# Patient Record
Sex: Female | Born: 1970 | Race: White | Hispanic: Yes | Marital: Married | Smoking: Never smoker
Health system: Southern US, Community
[De-identification: ages and names within clinical notes are randomized; demographics above are authoritative.]

## PROBLEM LIST (undated history)

## (undated) DIAGNOSIS — E669 Obesity, unspecified: Secondary | ICD-10-CM

## (undated) HISTORY — DX: Obesity, unspecified: E66.9

---

## 1992-09-18 HISTORY — PX: TUBAL LIGATION: SHX77

## 1993-09-18 DIAGNOSIS — H7292 Unspecified perforation of tympanic membrane, left ear: Secondary | ICD-10-CM

## 1993-09-18 HISTORY — DX: Unspecified perforation of tympanic membrane, left ear: H72.92

## 2010-05-17 ENCOUNTER — Emergency Department (HOSPITAL_COMMUNITY): Admission: EM | Admit: 2010-05-17 | Discharge: 2010-05-18 | Payer: Self-pay | Admitting: Emergency Medicine

## 2010-05-22 ENCOUNTER — Emergency Department (HOSPITAL_COMMUNITY): Admission: EM | Admit: 2010-05-22 | Discharge: 2010-05-22 | Payer: Self-pay | Admitting: Emergency Medicine

## 2010-12-01 LAB — COMPREHENSIVE METABOLIC PANEL
ALT: 28 U/L (ref 0–35)
AST: 23 U/L (ref 0–37)
Albumin: 3.9 g/dL (ref 3.5–5.2)
Alkaline Phosphatase: 65 U/L (ref 39–117)
BUN: 11 mg/dL (ref 6–23)
Calcium: 8.8 mg/dL (ref 8.4–10.5)
Chloride: 110 mEq/L (ref 96–112)
Creatinine, Ser: 0.55 mg/dL (ref 0.4–1.2)
Glucose, Bld: 96 mg/dL (ref 70–99)
Potassium: 3.9 mEq/L (ref 3.5–5.1)

## 2010-12-01 LAB — DIFFERENTIAL
Basophils Absolute: 0.1 10*3/uL (ref 0.0–0.1)
Eosinophils Absolute: 0.2 10*3/uL (ref 0.0–0.7)
Eosinophils Relative: 2 % (ref 0–5)
Lymphocytes Relative: 42 % (ref 12–46)
Lymphs Abs: 3.4 10*3/uL (ref 0.7–4.0)
Monocytes Absolute: 0.5 10*3/uL (ref 0.1–1.0)
Neutro Abs: 4 10*3/uL (ref 1.7–7.7)

## 2010-12-01 LAB — POCT CARDIAC MARKERS

## 2010-12-01 LAB — LIPASE, BLOOD: Lipase: 28 U/L (ref 11–59)

## 2010-12-01 LAB — CBC
HCT: 36 % (ref 36.0–46.0)
Hemoglobin: 12.2 g/dL (ref 12.0–15.0)
MCHC: 33.9 g/dL (ref 30.0–36.0)
MCV: 72.7 fL — ABNORMAL LOW (ref 78.0–100.0)
RBC: 4.95 MIL/uL (ref 3.87–5.11)

## 2010-12-02 LAB — HEPATIC FUNCTION PANEL
ALT: 25 U/L (ref 0–35)
AST: 24 U/L (ref 0–37)
Albumin: 4.3 g/dL (ref 3.5–5.2)
Alkaline Phosphatase: 70 U/L (ref 39–117)
Bilirubin, Direct: 0.1 mg/dL (ref 0.0–0.3)
Indirect Bilirubin: 0.5 mg/dL (ref 0.3–0.9)
Total Protein: 7.8 g/dL (ref 6.0–8.3)

## 2010-12-02 LAB — URINALYSIS, ROUTINE W REFLEX MICROSCOPIC
Bilirubin Urine: NEGATIVE
Glucose, UA: NEGATIVE mg/dL
Hgb urine dipstick: NEGATIVE
Nitrite: NEGATIVE
Urobilinogen, UA: 0.2 mg/dL (ref 0.0–1.0)
pH: 6.5 (ref 5.0–8.0)

## 2010-12-02 LAB — CBC
HCT: 38.4 % (ref 36.0–46.0)
MCH: 24.4 pg — ABNORMAL LOW (ref 26.0–34.0)
MCHC: 33.3 g/dL (ref 30.0–36.0)
MCV: 73.1 fL — ABNORMAL LOW (ref 78.0–100.0)
Platelets: 251 10*3/uL (ref 150–400)

## 2010-12-02 LAB — BASIC METABOLIC PANEL
BUN: 10 mg/dL (ref 6–23)
Calcium: 9.3 mg/dL (ref 8.4–10.5)
Chloride: 108 mEq/L (ref 96–112)
Creatinine, Ser: 0.68 mg/dL (ref 0.4–1.2)
GFR calc non Af Amer: >60 mL/min (ref >60)

## 2010-12-02 LAB — DIFFERENTIAL
Basophils Absolute: 0.1 10*3/uL (ref 0.0–0.1)
Basophils Relative: 1 % (ref 0–1)
Lymphs Abs: 4.2 10*3/uL — ABNORMAL HIGH (ref 0.7–4.0)
Monocytes Relative: 8 % (ref 3–12)

## 2010-12-02 LAB — URINE MICROSCOPIC-ADD ON

## 2011-10-29 IMAGING — CT CT ABD-PELV W/ CM
2 of 4 series · 12 of 36 positions shown, 19 images · IV contrast (water & 100ml omni 300)
Comparison: None.

CLINICAL DATA: Abdominal pain.  Epigastric pain.  Nausea.

CT ABDOMEN AND PELVIS WITH CONTRAST
TECHNIQUE: Multidetector CT imaging of the abdomen and pelvis was
performed following the standard protocol during bolus
administration of intravenous contrast.
Contrast: 100 ml [id number].

[Series 2: routine abdomen · axial · 0.98mm/px · z∈[-459,-39]mm · 11 of 102 slices shown, 17 images]
[im 9/102  soft-tissue]
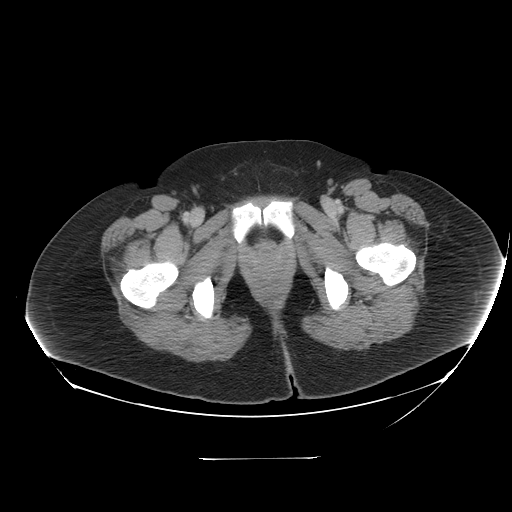
[im 9/102  bone]
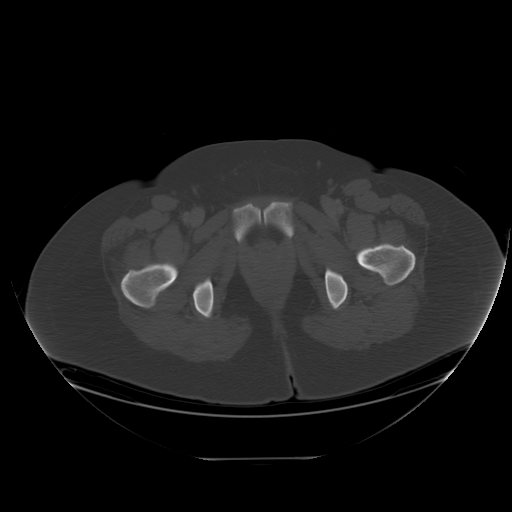
[im 17/102  soft-tissue]
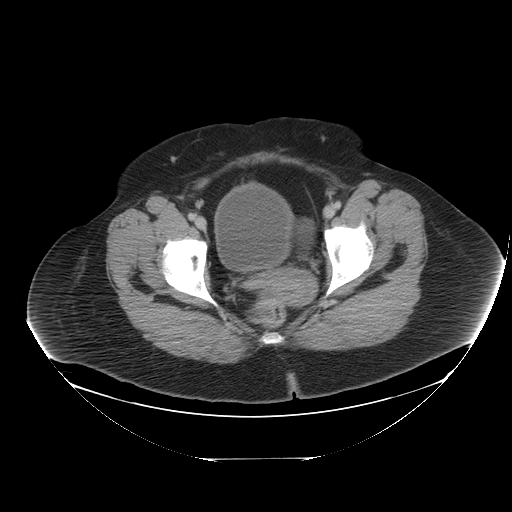
[im 26/102  soft-tissue]
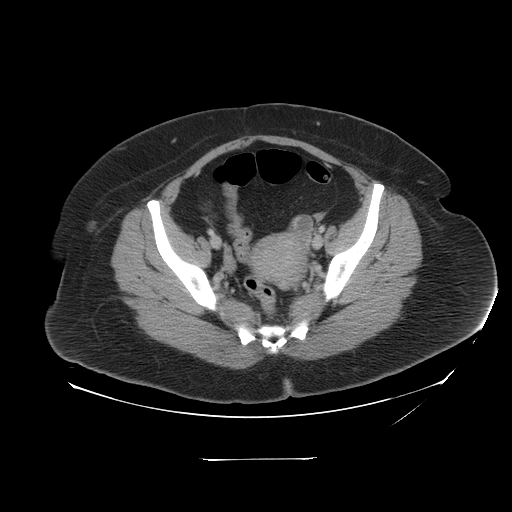
[im 34/102  soft-tissue]
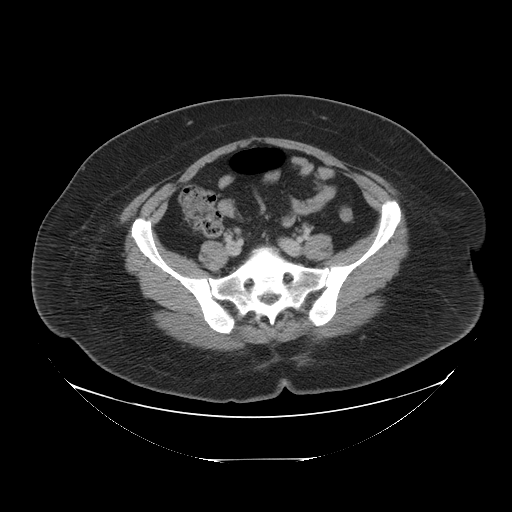
[im 43/102  soft-tissue]
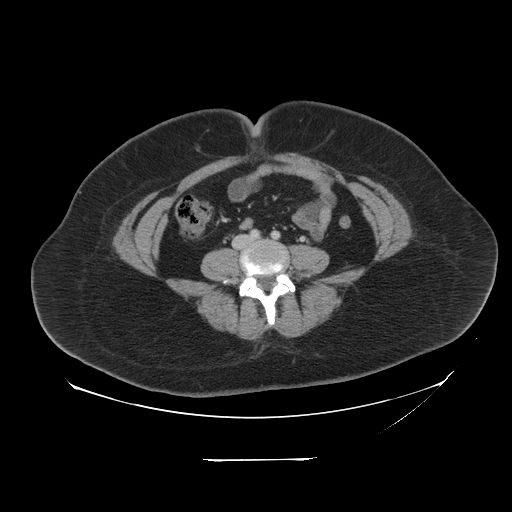
[im 51/102  soft-tissue]
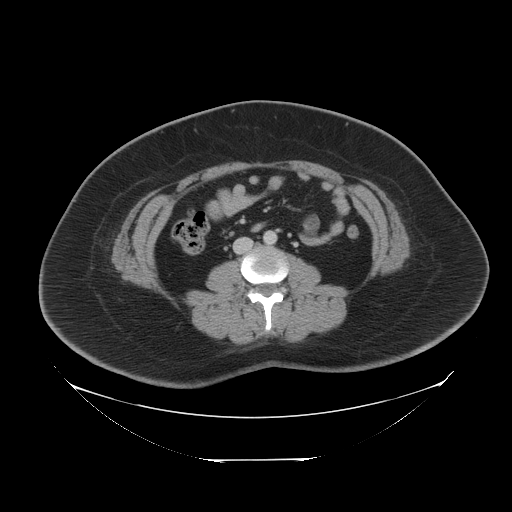
[im 59/102  soft-tissue]
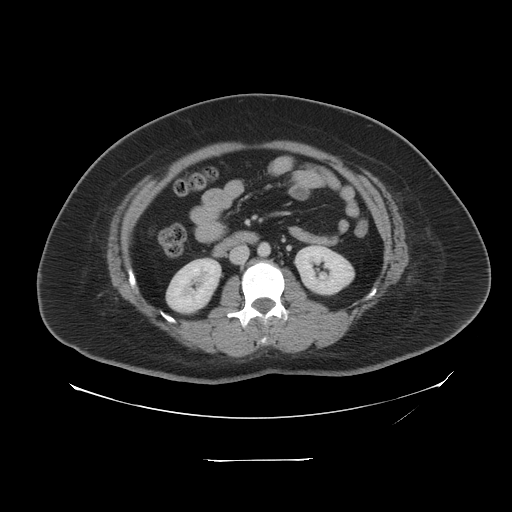
[im 68/102  soft-tissue]
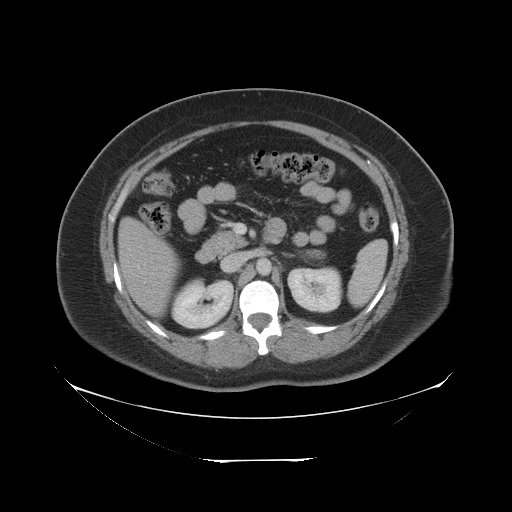
[im 68/102  lung]
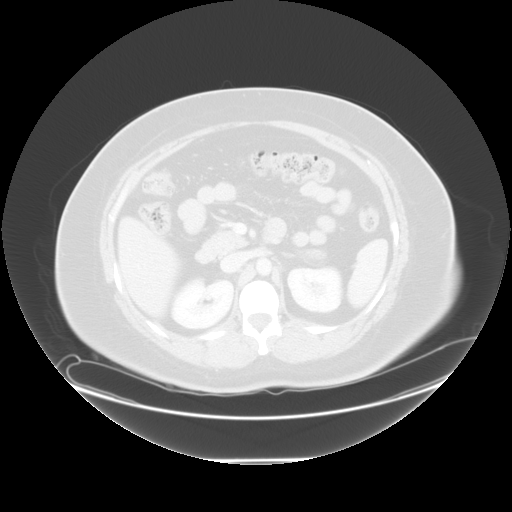
[im 76/102  soft-tissue]
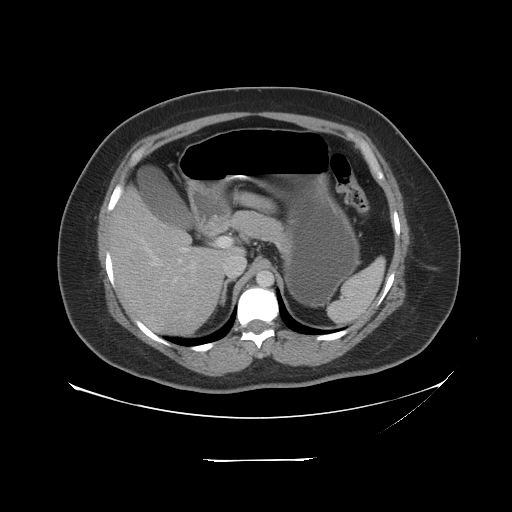
[im 76/102  lung]
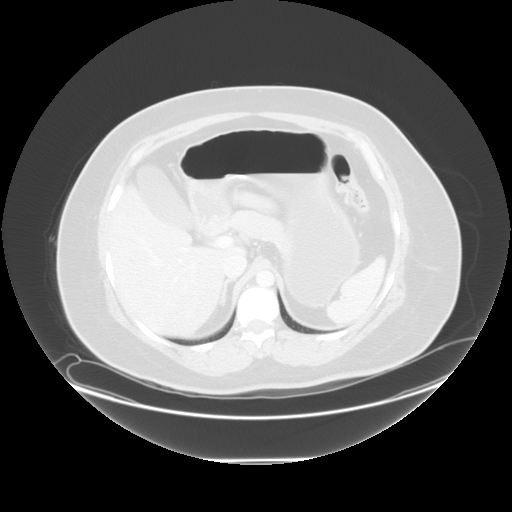
[im 76/102  bone]
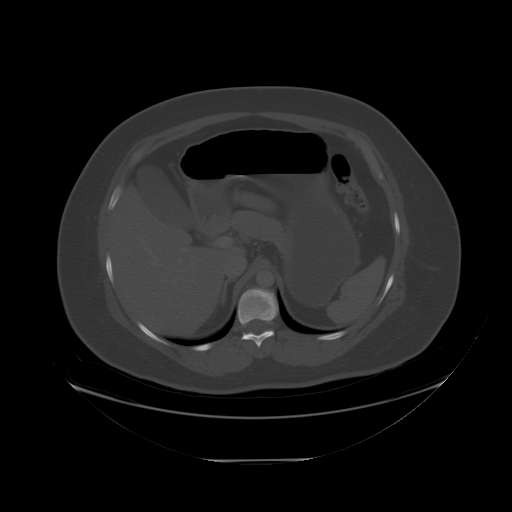
[im 85/102  soft-tissue]
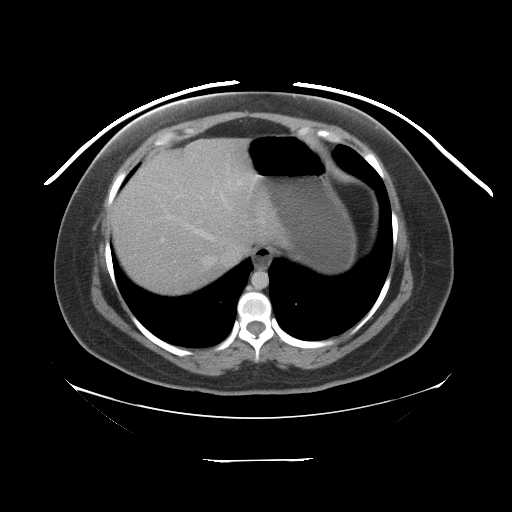
[im 85/102  lung]
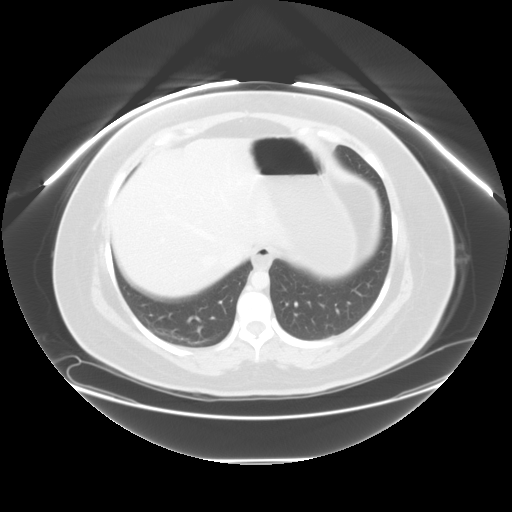
[im 93/102  soft-tissue]
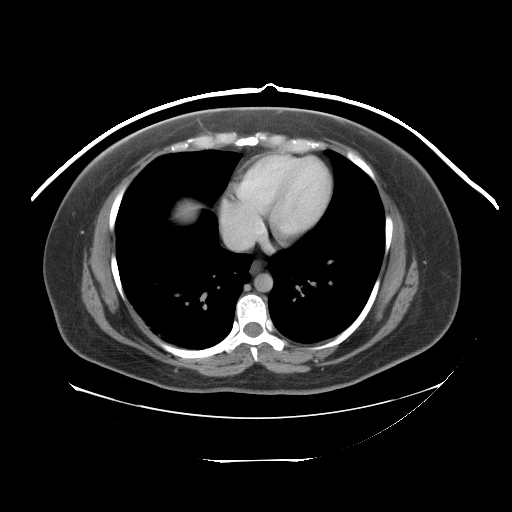
[im 93/102  lung]
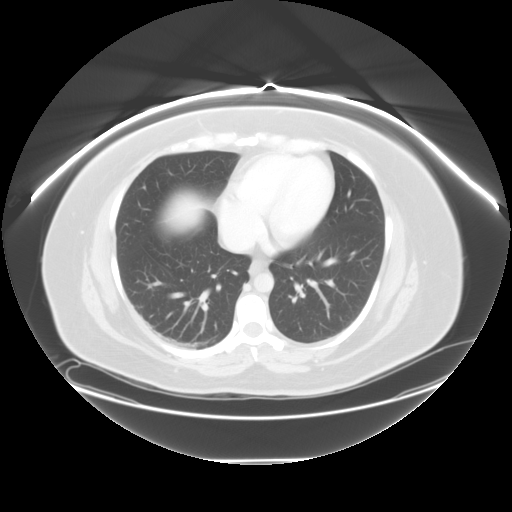

[Series 400: sag · sagittal · 1.01mm/px · 1 of 150 slices shown, 2 images]
[im 50/150  soft-tissue]
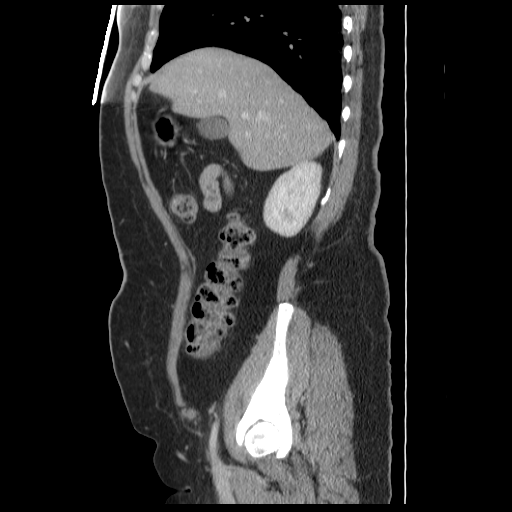
[im 50/150  bone]
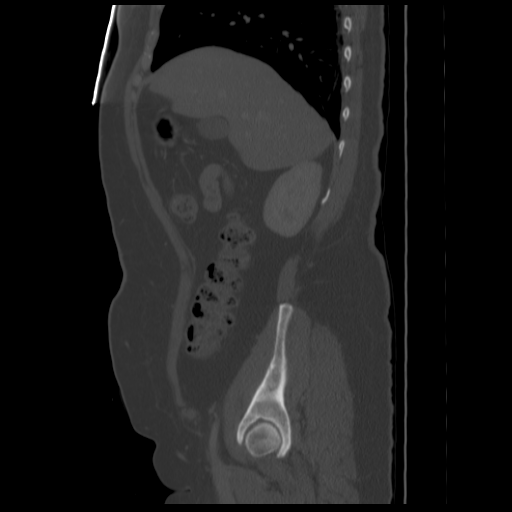

[12 of 36 positions shown; findings below may reference images not displayed]

FINDINGS: Lung bases show dependent atelectasis.  Liver,
gallbladder, pancreas, spleen appear within normal limits.  Common
bile duct is normal.  Stomach distended with negative oral
contrast.  Duodenum normal.  Small bowel normal caliber.  Normal
appendix identified.  No free fluid or lymphadenopathy.  Abdominal
vasculature normal.

Left ovarian cyst is present measuring 5.4 x 3.1 cm.  6-week follow
up transvaginal ultrasound recommended to ensure resolution.
Urinary bladder appears within normal limits.  The kidneys show
normal enhancement.  Adrenal glands normal.
IMPRESSION: 1.  No acute abnormality.
2.  5.4 cm long axis left adnexal cyst.  Follow-up 6-week
transvaginal ultrasound recommended to ensure resolution.

## 2013-10-19 ENCOUNTER — Encounter (HOSPITAL_COMMUNITY): Payer: Self-pay | Admitting: Emergency Medicine

## 2013-10-19 ENCOUNTER — Emergency Department (HOSPITAL_COMMUNITY)
Admission: EM | Admit: 2013-10-19 | Discharge: 2013-10-19 | Disposition: A | Discharge status: Home / Self Care | Payer: Self-pay | Attending: Emergency Medicine | Admitting: Emergency Medicine

## 2013-10-19 ENCOUNTER — Emergency Department (HOSPITAL_COMMUNITY): Payer: Self-pay

## 2013-10-19 DIAGNOSIS — Z79899 Other long term (current) drug therapy: Secondary | ICD-10-CM | POA: Insufficient documentation

## 2013-10-19 DIAGNOSIS — J069 Acute upper respiratory infection, unspecified: Secondary | ICD-10-CM | POA: Insufficient documentation

## 2013-10-19 DIAGNOSIS — J029 Acute pharyngitis, unspecified: Secondary | ICD-10-CM

## 2013-10-19 MED ORDER — DEXAMETHASONE SODIUM PHOSPHATE 10 MG/ML IJ SOLN
10.0000 mg | Freq: Once | INTRAMUSCULAR | Status: AC
  Administered 2013-10-19: 10 mg via INTRAMUSCULAR
  Filled 2013-10-19: qty 1

## 2013-10-19 MED ORDER — HYDROCODONE-HOMATROPINE 5-1.5 MG/5ML PO SYRP: 5.0000 mL | ORAL_SOLUTION | Freq: Four times a day (QID) | ORAL | Status: DC | PRN

## 2013-10-19 NOTE — ED Provider Notes (Signed)
CSN: [id number]     Arrival date & time 10/19/13  1111 History   First MD Initiated Contact with Patient 10/19/13 1122     Chief Complaint  Patient presents with  . Sore Throat  . Fever   (Consider location/radiation/quality/duration/timing/severity/associated sxs/prior Treatment) Patient is a 43 y.o. female presenting with pharyngitis. The history is provided by the patient and a relative. The history is limited by a language barrier. Language interpreter used: patient's son.  Sore Throat This is a new problem. The current episode started in the past 7 days. The problem occurs constantly. The problem has been unchanged. Associated symptoms include congestion, coughing, a fever and a sore throat. Pertinent negatives include no abdominal pain, anorexia, arthralgias, change in bowel habit, chest pain, chills, diaphoresis, fatigue, headaches, joint swelling, myalgias, nausea, neck pain, numbness, rash, swollen glands, urinary symptoms, vertigo, visual change, vomiting or weakness. The symptoms are aggravated by drinking, eating and swallowing. She has tried NSAIDs and acetaminophen (augmentin, tessalon) for the symptoms. The treatment provided moderate relief.    History reviewed. No pertinent past medical history. History reviewed. No pertinent past surgical history. No family history on file. History  Substance Use Topics  . Smoking status: Never Smoker   . Smokeless tobacco: Not on file  . Alcohol Use: No   OB History   Grav Para Term Preterm Abortions TAB SAB Ect Mult Living                 Review of Systems  Constitutional: Positive for fever. Negative for chills, diaphoresis and fatigue.  HENT: Positive for congestion and sore throat.   Respiratory: Positive for cough.   Cardiovascular: Negative for chest pain.  Gastrointestinal: Negative for nausea, vomiting, abdominal pain, anorexia and change in bowel habit.  Musculoskeletal: Negative for arthralgias, joint swelling, myalgias  and neck pain.  Skin: Negative for rash.  Neurological: Negative for vertigo, weakness, numbness and headaches.    Allergies  Review of patient's allergies indicates no known allergies.  Home Medications   Current Outpatient Rx  Name  Route  Sig  Dispense  Refill  . amoxicillin-clavulanate (AUGMENTIN) 875-125 MG per tablet   Oral   Take 1 tablet by mouth 2 (two) times daily.         . benzonatate (TESSALON) 100 MG capsule   Oral   Take 100 mg by mouth 3 (three) times daily as needed for cough.         Marland Kitchen ibuprofen (ADVIL,MOTRIN) 800 MG tablet   Oral   Take 800 mg by mouth every 8 (eight) hours as needed for moderate pain.         Marland Kitchen loratadine (CLARITIN) 10 MG tablet   Oral   Take 10 mg by mouth daily.          BP 101/61  Pulse 103  Temp(Src) 98.8 F (37.1 C) (Oral)  Resp 20  SpO2 98%  LMP 10/12/2013 Physical Exam Appears moderately ill but not toxic; temperature as noted in vitals. Ears normal. Eyes:glassy appearance, no discharge  Heart: RRR, NO M/G/R Throat and pharynx with mild erythema, some tonsillar swelling Neck supple. No adenopathyhy in the neck.  Sinuses non tender.  The chest is clear. Abdomen is soft and non tender  ED Course  Procedures (including critical care time) Labs Review Labs Reviewed - No data to display Imaging Review Dg Chest 2 View  10/19/2013   CLINICAL DATA:  Sore throat, fever and body aches  EXAM: CHEST  2  VIEW  COMPARISON:  None.  FINDINGS: The heart size and mediastinal contours are within normal limits. Both lungs are clear. The visualized skeletal structures are unremarkable.  IMPRESSION: No active cardiopulmonary disease.   Electronically Signed   By: Amie Portlandavid  Ormond M.D.   On: 10/19/2013 12:01    EKG Interpretation   None       MDM  No diagnosis found. Patient tachycardic, likely secondary to decreased oral intake. Patient is currently on augnmentin. She has only four days of sxs. I feel she is not yet improving  because this is likely a viral infection. Patient will get decadron here to decrease pharyngeal swelling. She is afebrile. Supportive care at home and continue ABX. Return precautions discussed.    Arthor CaptainAbigail Tyreonna Czaplicki, PA-C 10/22/13 [phone]

## 2013-10-19 NOTE — ED Notes (Signed)
Pt from home c/o sore throat, fever x4 days. Pt denies N/V/D. Pt is A&O and in NAD. Pt does not speak english, adult family is interpreting.

## 2013-10-19 NOTE — Discharge Instructions (Signed)
You appear to have an upper respiratory infection (URI). An upper respiratory tract infection, or cold, is a viral infection of the air passages leading to the lungs. It is contagious and can be spread to others, especially during the first 3 or 4 days. It cannot be cured by antibiotics or other medicines. RETURN IMMEDIATELY IF you develop shortness of breath, confusion or altered mental status, a new rash, become dizzy, faint, or poorly responsive, or are unable to be cared for at home.   Infeccin de las vas areas superiores en los adultos  (Upper Respiratory Infection, Adult)    La infeccin respiratoria de las vas areas superiores se conoce tambin como resfro comn. Las vas areas superiores Baxter Internationalincluyen los senos nasales, la garganta, la trquea, y los bronquios. Los bronquios son las vas areas que conducen el aire a los pulmones. La mayor parte de las personas mejora luego de una Enfieldsemana, Biomedical engineerpero los sntomas pueden durar The Interpublic Group of Companieshasta dos semanas. La tos residual puede durar ms.  CAUSAS  Varios tipos de virus pueden causar la infeccin de los tejidos que cubren las vas areas superiores. Los tejidos se irritan y se inflaman y se originan secreciones. Tambin es frecuente la produccin de moco. El resfro es contagioso. El virus se disemina fcilmente a otras personas por contacto oral. Aqu se incluyen los besos, el compartir un vaso y el toser o Engineering geologistestornudar. Tambin puede diseminarse tocndose la boca o la [place]nariz y luego tocando una superficie que luego tocan Economistotras personas.  SNTOMAS  Los sntomas se desarrollan entre uno y Hernandezlandtres das luego de Cytogeneticistentrar en contacto con el virus. Pueden variar de Neomia Dearuna persona a otra. Incluyen:  Secrecin nasal.  Estornudos  Congestin nasal.  Irritacin de los senos nasales.  Dolor de Advertising copywritergarganta.  Prdida de la voz (laringitis).  Tos.  Fatiga.  Dolores musculares.  Prdida del apetito.  Dolor de Turkmenistancabeza.  Fiebre no muy elevada. DIAGNSTICO  Puede diagnosticarse a  s mismo la infeccin respiratoria, segn los sntomas habituales, ya que la mayor parte de las personas se resfra dos o tres veces al ao. El profesional puede confirmarlo basndose en el examen fsico. Lo ms importante es que el profesional verifique que los sntomas no se deben a otra enfermedad como anginas, sinusitis, neumona, asma o epiglotitis. Para diagnosticar el resfrio comn, no es necesario que haga anlisis de Cartersangre, pruebas en la garganta o radiografas, pero en algunos casos puede ser de utilidad para excluir otros problemas ms graves. El mdico decidir si necesita otras pruebas.  RIESGOS Y COMPLICACIONES  Tendr mayor riesgo de sufrir un resfro grave si consume cigarrillos, sufre una enfermedad cardaca (como insuficiencia cardaca) o pulmonar crnica (como asma) o si tiene un debilitamiento del sistema inmunolgico. Las personas muy jvenes o muy mayores tienen riesgo de sufrir infecciones ms graves. La sinusitis bacteriana, las infecciones del odo medio y la neumona bacteriana pueden complicar el resfro comn. El resfro puede exacerbar el asma y la enfermedad pulmonar obstructiva crnica. En algunos casos estas complicaciones requieren la atencin en un servicio de emergencias y pueden poner en peligro la vida.  PREVENCIN  La mejor manera de protegerse para no contraer un resfro es Pharmacologistmantener una buena higiene. Evite el contacto bucal o de las manos con personas con sntomas de resfro. Si se produce el contacto, lvese las manos con frecuencia. No hay pruebas firmes que indiquen que la vitamina C, la vitamina E, la equincea o la actividad fsica reduzcan las posibilidades de tener una infeccin.  Sin embargo, siempre se recomienda Insurance account manager y Winferd Humphrey buena nutricin.  TRATAMIENTO  El tratamiento est dirigido a Consulting civil engineer sntomas. Esta enfermedad no tiene [place]. Los antibiticos no son eficaces, ya que esta infeccin la causa un virus y no una bacteria. El tratamiento  incluye:  Aumente la ingesta de lquidos. Consumo de bebidas deportivas, que proporcionan electrolitos,azcares e hidratacin.  Inhale vapor caliente (de un vaporizador o de la ducha).  Tomar sopa de pollo u otros lquidos claros, y [employer] buena nutricin.  Descanse lo suficiente.  Haga grgaras o coma pastillas para [employer].  Control de la fiebre con ibuprofeno o acetaminofen, segn las indicaciones del mdico.  Aumento del uso del inhalador, si sufre asma. Las pastillas y los geles de zinc durante las primeras 24 horas de iniciado el resfro comn, pueden disminuir la duracin y Paramedic la gravedad de los sntomas. Los medicamentos para Chief Technology Officer pueden disminuir la fiebre, Paramedic los dolores musculares y Chief Technology Officer de Advertising copywriter. Se dispone de una gran variedad de medicamentos de venta libre para tratar la congestin y la secrecin nasal. El profesional podr recomendarle inhalantes para los otros sntomas.  INSTRUCCIONES PARA EL CUIDADO DOMICILIARIO  Utilice los medicamentos de venta libre o de prescripcin para Chief Technology Officer, el malestar o la [place], segn se lo indique el profesional que lo asiste.  Utilice un vaporizador caliente o inhale vapor, haciendo salir agua de la ducha para aumentar la humedad [place]. Esto mantendr las secreciones hmedas y Community education officer ms fcil respirar.  Beba gran cantidad de lquido para mantener la orina de tono claro o color amarillo plido.  Descanse todo lo que pueda.  Regrese a su trabajo cuando la temperatura se haya normalizado, o cuando el profesional que lo asiste se lo indique. Quizs sea necesario que permanezca en su casa durante un tiempo ms prolongado para Buyer, retail a Economist. Tambin puede utilizar un barbijo y ser cuidadoso con el lavado de manos para evitar la diseminacin del virus. SOLICITE ATENCIN MDICA SI:  Luego de los primeros das siente que empeora en vez de [place].  Necesita que Occupational psychologist brinde ms  informacin relacionada con los medicamentos para [employer] sntomas.  Siente escalofros, le falta el aire o escupe moco de color marrn o rojo. Estos pueden ser sntomas de neumona.  Tiene una secrecin nasal de color amarillo o marrn, o siente dolor en el rostro, especialmente cuando se inclina hacia adelante. Estos pueden ser sntomas de sinusitis.  Tiene fiebre, siente el cuello hinchado, tiene dolor al tragar u observa manchas blancas en el fondo de la garganta. Estos pueden ser sntomas de angina por estreptococo. Romona Curls ATENCIN MDICA DE INMEDIATO SI:  Lance Muss.  Comienza a sentir Herbalist de cabeza intenso o persistente, dolor de odos, en el seno nasal o en el pecho.  Tiene tos y esta se prolonga demasiado, tose y escupe sangre, la mucosidad habitual se modifica (si tiene una enfermedad pulmonar crnica) o respira con dificultad.  Siente rigidez en el cuello o dolor de cabeza intenso. Document Released: 06/14/2005 Document Revised: 11/27/2011  [facility] Patient Information 2014 [place], [place].

## 2013-10-22 NOTE — ED Provider Notes (Signed)
Medical screening examination/treatment/procedure(s) were performed by non-physician practitioner and as supervising physician I was immediately available for consultation/collaboration.  EKG Interpretation   None         David H Yao, MD 10/22/13 0659 

## 2013-11-18 ENCOUNTER — Ambulatory Visit: Payer: No Typology Code available for payment source | Attending: Internal Medicine

## 2013-11-21 ENCOUNTER — Encounter: Payer: Self-pay | Admitting: Internal Medicine

## 2013-11-21 ENCOUNTER — Ambulatory Visit: Payer: No Typology Code available for payment source | Attending: Internal Medicine | Admitting: Internal Medicine

## 2013-11-21 VITALS — BP 101/71 | HR 63 | Temp 97.8°F | Resp 14 | Ht 64.0 in | Wt 219.8 lb

## 2013-11-21 DIAGNOSIS — J029 Acute pharyngitis, unspecified: Secondary | ICD-10-CM

## 2013-11-21 DIAGNOSIS — Z0000 Encounter for general adult medical examination without abnormal findings: Secondary | ICD-10-CM

## 2013-11-21 LAB — COMPREHENSIVE METABOLIC PANEL
ALT: 48 U/L — AB (ref 0–35)
AST: 39 U/L — AB (ref 0–37)
Albumin: 4.3 g/dL (ref 3.5–5.2)
Alkaline Phosphatase: 87 U/L (ref 39–117)
BILIRUBIN TOTAL: 0.6 mg/dL (ref 0.2–1.2)
BUN: 15 mg/dL (ref 6–23)
CALCIUM: 9.2 mg/dL (ref 8.4–10.5)
CHLORIDE: 102 meq/L (ref 96–112)
CO2: 26 meq/L (ref 19–32)
Creat: 0.63 mg/dL (ref 0.50–1.10)
Glucose, Bld: 101 mg/dL — ABNORMAL HIGH (ref 70–99)
POTASSIUM: 4.5 meq/L (ref 3.5–5.3)
SODIUM: 136 meq/L (ref 135–145)
TOTAL PROTEIN: 7.3 g/dL (ref 6.0–8.3)

## 2013-11-21 LAB — HEMOGLOBIN A1C
Hgb A1c MFr Bld: 5 % (ref <5.7)
Mean Plasma Glucose: 97 mg/dL (ref <117)

## 2013-11-21 LAB — CBC WITH DIFFERENTIAL/PLATELET
BASOS ABS: 0.1 10*3/uL (ref 0.0–0.1)
Basophils Relative: 1 % (ref 0–1)
Eosinophils Absolute: 0.1 10*3/uL (ref 0.0–0.7)
Eosinophils Relative: 1 % (ref 0–5)
HEMATOCRIT: 37.7 % (ref 36.0–46.0)
HEMOGLOBIN: 12.3 g/dL (ref 12.0–15.0)
LYMPHS PCT: 50 % — AB (ref 12–46)
Lymphs Abs: 3 10*3/uL (ref 0.7–4.0)
MCH: 24.6 pg — AB (ref 26.0–34.0)
MCHC: 32.6 g/dL (ref 30.0–36.0)
MCV: 75.2 fL — ABNORMAL LOW (ref 78.0–100.0)
MONOS PCT: 9 % (ref 3–12)
Monocytes Absolute: 0.5 10*3/uL (ref 0.1–1.0)
NEUTROS ABS: 2.3 10*3/uL (ref 1.7–7.7)
NEUTROS PCT: 39 % — AB (ref 43–77)
Platelets: 277 10*3/uL (ref 150–400)
RBC: 5.01 MIL/uL (ref 3.87–5.11)
RDW: 23 % — AB (ref 11.5–15.5)
WBC: 6 10*3/uL (ref 4.0–10.5)

## 2013-11-21 LAB — LIPID PANEL
Cholesterol: 188 mg/dL (ref 0–200)
HDL: 42 mg/dL (ref >39)
LDL Cholesterol: 126 mg/dL — ABNORMAL HIGH (ref 0–99)
Total CHOL/HDL Ratio: 4.5 Ratio
Triglycerides: 98 mg/dL (ref <150)
VLDL: 20 mg/dL (ref 0–40)

## 2013-11-21 MED ORDER — AMOXICILLIN 500 MG PO CAPS: 500.0000 mg | ORAL_CAPSULE | Freq: Three times a day (TID) | ORAL | Status: DC

## 2013-11-21 MED ORDER — LORATADINE 10 MG PO TABS: 10.0000 mg | ORAL_TABLET | Freq: Every day | ORAL | Status: DC

## 2013-11-21 NOTE — Progress Notes (Unsigned)
Patient ID: Deborah Vaughan, female   DOB: Jan 28, 1971, 43 y.o.   MRN: [id number] Patient Demographics  Deborah Vaughan, is a 43 y.o. female  [id number]  [id number]  DOB - Jan 28, 1971  Chief Complaint  Patient presents with  . Establish Care  . Sore Throat        Subjective:   Deborah Vaughan today is here to establish primary care.  Patient is a 43 year old Hispanic female with no past medical history presented to establish care. Patient reports sore throat for almost a month, no fevers or chills or any nasal congestion. She feels postnasal drip and mucus in the throat, no coughing. She feels sinus pressure headaches. She was seen in the ER and was prescribed Augmentin for acute pharyngitis. History was obtained through the interpreter. Patient has No chest pain, No abdominal pain - No Nausea, No new weakness tingling or numbness, No Cough - SOB.  Objective:    Filed Vitals:   11/21/13 0912  BP: 101/71  Pulse: 63  Temp: 97.8 F (36.6 C)  TempSrc: Oral  Resp: 14  Height: 5\' 4"  (1.626 m)  Weight: 219 lb 12.8 oz (99.701 kg)  SpO2: 96%     ALLERGIES:  No Known Allergies  PAST MEDICAL HISTORY: History reviewed. No pertinent past medical history.  PAST SURGICAL HISTORY: Past Surgical History  Procedure Laterality Date  . Cesarean section      FAMILY HISTORY: Family History  Problem Relation Age of Onset  . Cancer Father   . Cancer Brother   . Diabetes Brother     MEDICATIONS AT HOME: Prior to Admission medications   Medication Sig Start Date End Date Taking? Authorizing Provider  amoxicillin (AMOXIL) 500 MG capsule Take 1 capsule (500 mg total) by mouth 3 (three) times daily. X 10days 11/21/13   Chaniya Genter Jenna LuoK Nataly Pacifico, MD  amoxicillin-clavulanate (AUGMENTIN) 875-125 MG per tablet Take 1 tablet by mouth 2 (two) times daily.    Historical Provider, MD  benzonatate (TESSALON) 100 MG capsule Take 100 mg by mouth 3 (three) times daily as needed for  cough.    Historical Provider, MD  HYDROcodone-homatropine (HYCODAN) 5-1.5 MG/5ML syrup Take 5 mLs by mouth every 6 (six) hours as needed for cough. 10/19/13   Arthor CaptainAbigail Harris, PA-C  ibuprofen (ADVIL,MOTRIN) 800 MG tablet Take 800 mg by mouth every 8 (eight) hours as needed for moderate pain.    Historical Provider, MD  loratadine (CLARITIN) 10 MG tablet Take 10 mg by mouth daily.    Historical Provider, MD    REVIEW OF SYSTEMS:  Constitutional:   No   Fevers, chills, fatigue.  HEENT:    See history of present illness  Cardio-vascular: No chest pain,  Orthopnea, swelling in lower extremities, anasarca, palpitations  GI:  No abdominal pain, nausea, vomiting, diarrhea  Resp: No shortness of breath,  No coughing up of blood.No cough.No wheezing.  Skin:  no rash or lesions.  GU:  no dysuria, change in color of urine, no urgency or frequency.  No flank pain.  Musculoskeletal: No joint pain or swelling.  No decreased range of motion.  No back pain.  Psych: No change in mood or affect. No depression or anxiety.  No memory loss.   Exam  General appearance :Awake, alert, NAD, Speech Clear. HEENT: Atraumatic and Normocephalic, PERLA, bilateral tonsils enlarged, no significant posterior pharyngeal erythema Neck: supple, no JVD. No cervical lymphadenopathy.  Chest: clear to auscultation bilaterally, no wheezing, rales or rhonchi CVS: S1 S2 regular, no murmurs.  Abdomen: soft, NBS, NT, ND, no gaurding, rigidity or rebound. Extremities: No cyanosis, clubbing, B/L Lower Ext shows no edema,  Neurology: Awake alert, and oriented X 3, CN II-XII intact, Non focal Skin:No Rash or lesions Wounds: N/A    Data Review   Basic Metabolic Panel: No results found for this basename: NA, K, CL, CO2, GLUCOSE, BUN, CREATININE, CALCIUM, MG, PHOS,  in the last 168 hours Liver Function Tests: No results found for this basename: AST, ALT, ALKPHOS, BILITOT, PROT, ALBUMIN,  in the last 168  hours  CBC: No results found for this basename: WBC, NEUTROABS, HGB, HCT, MCV, PLT,  in the last 168 hours ------------------------------------------------------------------------------------------------------------------ No results found for this basename: HGBA1C,  in the last 72 hours ------------------------------------------------------------------------------------------------------------------ No results found for this basename: CHOL, HDL, LDLCALC, TRIG, CHOLHDL, LDLDIRECT,  in the last 72 hours ------------------------------------------------------------------------------------------------------------------ No results found for this basename: TSH, T4TOTAL, FREET3, T3FREE, THYROIDAB,  in the last 72 hours ------------------------------------------------------------------------------------------------------------------ No results found for this basename: VITAMINB12, FOLATE, FERRITIN, TIBC, IRON, RETICCTPCT,  in the last 72 hours  Coagulation profile  No results found for this basename: INR, PROTIME,  in the last 168 hours    Assessment & Plan   Active Problems:  Patient Active Problem List   Diagnosis Date Noted  . Acute pharyngitis - obtain rapid strep throat - CBC with diff,  - placed on amxicillin for 10days - will send referral to Gen. surgery for tonsillectomy evaluation   11/21/2013  . Periodic health assessment, general screening, adult - Obtain CBC, cmet, lipid panel  - Patient has strong family history of diabetes, obtain hemoglobin A1c - mammogram screening- never had a mammogram - Scheduled appointment for Pap smear, never had Pap smear before  11/21/2013    Recommendations: Followup in 3 months, labs  Follow-up in 3 months   Kashmere Staffa M.D. 11/21/2013, 9:43 AM

## 2013-11-21 NOTE — Progress Notes (Unsigned)
Patient is here to establish care. Patient complains of a sore throat x1 month; no fever, no chills, nasal congestion, feeling of mucus in the throat, no cough, pressure headaches. Recently taken medication prescribed from other physicians. No longer taking medication. Patient has an interpreter.

## 2013-11-27 ENCOUNTER — Encounter: Payer: Self-pay | Admitting: Family Medicine

## 2013-11-27 ENCOUNTER — Ambulatory Visit: Payer: No Typology Code available for payment source | Attending: Internal Medicine | Admitting: Family Medicine

## 2013-11-27 ENCOUNTER — Other Ambulatory Visit (HOSPITAL_COMMUNITY)
Admission: RE | Admit: 2013-11-27 | Discharge: 2013-11-27 | Disposition: A | Discharge status: Home / Self Care | Payer: No Typology Code available for payment source | Source: Ambulatory Visit | Attending: Internal Medicine | Admitting: Internal Medicine

## 2013-11-27 VITALS — BP 111/76 | HR 69 | Temp 98.7°F | Resp 16 | Ht 62.0 in | Wt 219.0 lb

## 2013-11-27 DIAGNOSIS — Z202 Contact with and (suspected) exposure to infections with a predominantly sexual mode of transmission: Secondary | ICD-10-CM

## 2013-11-27 DIAGNOSIS — N760 Acute vaginitis: Secondary | ICD-10-CM | POA: Insufficient documentation

## 2013-11-27 DIAGNOSIS — N949 Unspecified condition associated with female genital organs and menstrual cycle: Secondary | ICD-10-CM | POA: Insufficient documentation

## 2013-11-27 DIAGNOSIS — J029 Acute pharyngitis, unspecified: Secondary | ICD-10-CM

## 2013-11-27 DIAGNOSIS — R070 Pain in throat: Secondary | ICD-10-CM | POA: Insufficient documentation

## 2013-11-27 DIAGNOSIS — Z1151 Encounter for screening for human papillomavirus (HPV): Secondary | ICD-10-CM | POA: Insufficient documentation

## 2013-11-27 DIAGNOSIS — Z01419 Encounter for gynecological examination (general) (routine) without abnormal findings: Secondary | ICD-10-CM | POA: Insufficient documentation

## 2013-11-27 DIAGNOSIS — R102 Pelvic and perineal pain: Secondary | ICD-10-CM

## 2013-11-27 DIAGNOSIS — Z124 Encounter for screening for malignant neoplasm of cervix: Secondary | ICD-10-CM

## 2013-11-27 DIAGNOSIS — Z113 Encounter for screening for infections with a predominantly sexual mode of transmission: Secondary | ICD-10-CM | POA: Insufficient documentation

## 2013-11-27 MED ORDER — FLUTICASONE PROPIONATE 50 MCG/ACT NA SUSP: 2.0000 | Freq: Every day | NASAL | Status: DC

## 2013-11-27 NOTE — Patient Instructions (Signed)
Pelvic Pain, Female Female pelvic pain can be caused by many different things and start from a variety of places. Pelvic pain refers to pain that is located in the lower half of the abdomen and between your hips. The pain may occur over a short period of time (acute) or may be reoccurring (chronic). The cause of pelvic pain may be related to disorders affecting the female reproductive organs (gynecologic), but it may also be related to the bladder, kidney stones, an intestinal complication, or muscle or skeletal problems. Getting help right away for pelvic pain is important, especially if there has been severe, sharp, or a sudden onset of unusual pain. It is also important to get help right away because some types of pelvic pain can be life threatening.  CAUSES  Below are only some of the causes of pelvic pain. The causes of pelvic pain can be in one of several categories.   Gynecologic.  Pelvic inflammatory disease.  Sexually transmitted infection.  Ovarian cyst or a twisted ovarian ligament (ovarian torsion).  Uterine lining that grows outside the uterus (endometriosis).  Fibroids, cysts, or tumors.  Ovulation.  Pregnancy.  Pregnancy that occurs outside the uterus (ectopic pregnancy).  Miscarriage.  Labor.  Abruption of the placenta or ruptured uterus.  Infection.  Uterine infection (endometritis).  Bladder infection.  Diverticulitis.  Miscarriage related to a uterine infection (septic abortion).  Bladder.  Inflammation of the bladder (cystitis).  Kidney stone(s).  Gastrointenstinal.  Constipation.  Diverticulitis.  Neurologic.  Trauma.  Feeling pelvic pain because of mental or emotional causes (psychosomatic).  Cancers of the bowel or pelvis. EVALUATION  Your caregiver will want to take a careful history of your concerns. This includes recent changes in your health, a careful gynecologic history of your periods (menses), and a sexual history. Obtaining  your family history and medical history is also important. Your caregiver may suggest a pelvic exam. A pelvic exam will help identify the location and severity of the pain. It also helps in the evaluation of which organ system may be involved. In order to identify the cause of the pelvic pain and be properly treated, your caregiver may order tests. These tests may include:   A pregnancy test.  Pelvic ultrasonography.  An X-ray exam of the abdomen.  A urinalysis or evaluation of vaginal discharge.  Blood tests. HOME CARE INSTRUCTIONS   Only take over-the-counter or prescription medicines for pain, discomfort, or fever as directed by your caregiver.   Rest as directed by your caregiver.   Eat a balanced diet.   Drink enough fluids to make your urine clear or pale yellow, or as directed.   Avoid sexual intercourse if it causes pain.   Apply warm or cold compresses to the lower abdomen depending on which one helps the pain.   Avoid stressful situations.   Keep a journal of your pelvic pain. Write down when it started, where the pain is located, and if there are things that seem to be associated with the pain, such as food or your menstrual cycle.  Follow up with your caregiver as directed.  SEEK MEDICAL CARE IF:  Your medicine does not help your pain.  You have abnormal vaginal discharge. SEEK IMMEDIATE MEDICAL CARE IF:   You have heavy bleeding from the vagina.   Your pelvic pain increases.   You feel lightheaded or faint.   You have chills.   You have pain with urination or blood in your urine.   You have uncontrolled  diarrhea or vomiting.   You have a fever or persistent symptoms for more than 3 days.  You have a fever and your symptoms suddenly get worse.   You are being physically or sexually abused.  MAKE SURE YOU:  Understand these instructions.  Will watch your condition.  Will get help if you are not doing well or get worse. Document  Released: 08/01/2004 Document Revised: 03/05/2012 Document Reviewed: 12/25/2011 [facility] Patient Information 2014 [place], [place]. Sore Throat A sore throat is pain, burning, irritation, or scratchiness of the throat. There is often pain or tenderness when swallowing or talking. A sore throat may be accompanied by other symptoms, such as coughing, sneezing, fever, and swollen neck glands. A sore throat is often the first sign of another sickness, such as a cold, flu, strep throat, or mononucleosis (commonly known as mono). Most sore throats go away without medical treatment. CAUSES  The most common causes of a sore throat include:  A viral infection, such as a cold, flu, or mono.  A bacterial infection, such as strep throat, tonsillitis, or whooping cough.  Seasonal allergies.  Dryness in the air.  Irritants, such as smoke or pollution.  Gastroesophageal reflux disease (GERD). HOME CARE INSTRUCTIONS   Only take over-the-counter medicines as directed by your caregiver.  Drink enough fluids to keep your urine clear or pale yellow.  Rest as needed.  Try using throat sprays, lozenges, or sucking on hard candy to ease any pain (if older than 4 years or as directed).  Sip warm liquids, such as broth, herbal tea, or warm water with honey to relieve pain temporarily. You may also eat or drink cold or frozen liquids such as frozen ice pops.  Gargle with salt water (mix 1 tsp salt with 8 oz of water).  Do not smoke and avoid secondhand smoke.  Put a cool-mist humidifier in your bedroom at night to moisten the air. You can also turn on a hot shower and sit in the bathroom with the door closed for 5 10 minutes. SEEK IMMEDIATE MEDICAL CARE IF:  You have difficulty breathing.  You are unable to swallow fluids, soft foods, or your saliva.  You have increased swelling in the throat.  Your sore throat does not get better in 7 days.  You have nausea and vomiting.  You have a fever or  persistent symptoms for more than 2 3 days.  You have a fever and your symptoms suddenly get worse. MAKE SURE YOU:   Understand these instructions.  Will watch your condition.  Will get help right away if you are not doing well or get worse. Document Released: 10/12/2004 Document Revised: 08/21/2012 Document Reviewed: 05/12/2012 [facility] LouisExitCare Patient Information 2014 [place], [place].

## 2013-11-27 NOTE — Progress Notes (Signed)
Subjective:    Patient ID: Deborah Vaughan, female    DOB: [birth year], 43 y.o.   MRN: [id number]  Gynecologic Exam   Patient presents for Pap smear. Never had an abnormal Pap smear last test was 4 years ago she has had priors before that but isn't sure when they were. She denies any abnormal bleeding or discharge. He does complain of some left-sided pelvic pain. Last period was in January and was longer than usual. Has not had a period since that  She's concerned about possible exposure to sexually transmitted diseases she's had one partner in the last year. He has been traveling and so she's concerned that he's been unfaithful to her in the last year.  She continues to have a sore throat. Started in January. It's been waxing and waning over that time. He was treated at the end of January I. an outside provider with Augmentin. Patient says that there was not a throat culture her strep test done. This did not relieve the symptoms. In early February she went to the emergency department and was told she had an upper respiratory infection. In early March she was seen here. It seems that she was swabbed but no culture was done. She was treated empirically with amoxicillin. She was also referred to ENT at that time but hasn't seen them yet. He continues to have is moderate in severity she says that it seems to be aggravated by her sinus drainage. She has chronic postnasal drip and is not on anything for the sinus problem. She has no fevers. Denies sick contacts.   Review of Systems A 12 point review of systems is negative except as per hpi.       Objective:   Physical Exam  Genitourinary: Vagina normal and uterus normal. No labial fusion. There is no rash, tenderness, lesion or injury on the right labia. There is no rash, tenderness, lesion or injury on the left labia. Cervix exhibits no motion tenderness, no discharge and no friability. Right adnexum displays no mass, no tenderness and no  fullness. Left adnexum displays tenderness and fullness. No tenderness or bleeding around the vagina. No foreign body around the vagina. No vaginal discharge found.    Nursing note and vitals reviewed. Constitutional: She is oriented to person, place, and time. She appears well-developed and well-nourished.  HENT:  Mouth/Throat: Oropharynx is clear and moist. No oropharyngeal exudate.  Abdominal: Soft. Bowel sounds are normal. She exhibits no distension. There is no tenderness. There is no rebound.  Neurological: She is alert and oriented to person, place, and time. She has normal reflexes.  Skin: Skin is warm and dry.  Psychiatric: She has a normal mood and affect. Her behavior is normal.        Assessment & Plan:  Deborah Vaughan was seen today for follow-up and gynecologic exam.  Diagnoses and associated orders for this visit:  Sore throat - Throat culture - fluticasone (FLONASE) 50 MCG/ACT nasal spray; Place 2 sprays into both nostrils daily. - MDM - we are hoping that the Flonase helps this problem. It's concerning that her sore throat has been going on for almost 2 months now. At this point it doesn't seem reasonable to prescribe more antibiotics so will wait for the results of the throat culture. Agree with referral to ENT and we'll make sure that she gets in to see them soon. Screening for cervical cancer - Cytology - PAP [place]  Potential exposure to STD - Cytology - PAP [facility] - Have also sent off STD panel and gonorrhea and chlamydia Pelvic pain in female - US Transvaginal Non-OB; Future  F/u - with ENT, get the ultrasound. Followup here in 3 months or earlier if needed. Emergency department if acutely worse.

## 2013-11-27 NOTE — Progress Notes (Signed)
Pt here for pap smear with c/o lower pelvic pain, non radiating  pt is taking ATB

## 2013-11-28 LAB — CERVICOVAGINAL ANCILLARY ONLY
Bacterial vaginitis: POSITIVE — AB
Candida vaginitis: NEGATIVE

## 2013-12-02 ENCOUNTER — Telehealth: Payer: Self-pay | Admitting: *Deleted

## 2013-12-02 NOTE — Telephone Encounter (Signed)
Message copied by Raynelle CharyWINFREE, Bryauna Byrum R on Tue Dec 02, 2013  2:04 PM ------      Message from: Quentin AngstJEGEDE, OLUGBEMIGA E      Created: Sun Nov 30, 2013  8:34 PM       Please inform patient that her swab is positive for bacterial vaginosis, not a sexually transmitted disease, we are still awaiting other results. We will treat this with antibiotics      Please call in metronidazole tablets 500 mg by mouth twice a day for 7 days. ------

## 2013-12-02 NOTE — Telephone Encounter (Signed)
Called pt and unable to leave a message

## 2013-12-04 ENCOUNTER — Other Ambulatory Visit: Payer: Self-pay | Admitting: Family Medicine

## 2013-12-04 ENCOUNTER — Ambulatory Visit (HOSPITAL_COMMUNITY)
Admission: RE | Admit: 2013-12-04 | Discharge: 2013-12-04 | Disposition: A | Discharge status: Home / Self Care | Payer: No Typology Code available for payment source | Source: Ambulatory Visit | Attending: Family Medicine | Admitting: Family Medicine

## 2013-12-04 DIAGNOSIS — R102 Pelvic and perineal pain unspecified side: Secondary | ICD-10-CM

## 2013-12-04 DIAGNOSIS — Q504 Embryonic cyst of fallopian tube: Secondary | ICD-10-CM | POA: Insufficient documentation

## 2013-12-04 DIAGNOSIS — Q505 Embryonic cyst of broad ligament: Principal | ICD-10-CM

## 2013-12-04 DIAGNOSIS — N949 Unspecified condition associated with female genital organs and menstrual cycle: Secondary | ICD-10-CM | POA: Insufficient documentation

## 2013-12-08 ENCOUNTER — Telehealth: Payer: Self-pay | Admitting: *Deleted

## 2013-12-08 NOTE — Telephone Encounter (Signed)
Message copied by Raynelle CharyWINFREE, Shereece Wellborn R on Mon Dec 08, 2013  3:59 PM ------      Message from: Acey LavWOOD, ALLISON L      Created: Mon Dec 08, 2013  1:10 PM       Please let pt know tvus wnl except cyst. Cyst is likely fine and will self resolve. If discomfort in 6-8 weeks, re-eval. thx aw ------

## 2013-12-08 NOTE — Telephone Encounter (Signed)
Pt is aware of her lab results.  

## 2013-12-08 NOTE — Telephone Encounter (Signed)
Message copied by Raynelle CharyWINFREE, Alahni Varone R on Mon Dec 08, 2013  4:08 PM ------      Message from: Acey LavWOOD, ALLISON L      Created: Mon Dec 08, 2013  1:15 PM       Please let pt know pap/gc/chlam/hpv/trich all neg. Thanks AW ------

## 2013-12-08 NOTE — Telephone Encounter (Signed)
Pt is aware of her US results.  

## 2014-01-07 ENCOUNTER — Ambulatory Visit: Payer: No Typology Code available for payment source

## 2014-01-07 ENCOUNTER — Encounter (INDEPENDENT_AMBULATORY_CARE_PROVIDER_SITE_OTHER): Payer: Self-pay

## 2014-01-07 ENCOUNTER — Ambulatory Visit
Admission: RE | Admit: 2014-01-07 | Discharge: 2014-01-07 | Disposition: A | Discharge status: Home / Self Care | Payer: Self-pay | Source: Ambulatory Visit | Attending: Internal Medicine | Admitting: Internal Medicine

## 2014-01-07 DIAGNOSIS — Z0000 Encounter for general adult medical examination without abnormal findings: Secondary | ICD-10-CM

## 2014-01-09 ENCOUNTER — Other Ambulatory Visit: Payer: Self-pay | Admitting: Internal Medicine

## 2014-01-09 DIAGNOSIS — R928 Other abnormal and inconclusive findings on diagnostic imaging of breast: Secondary | ICD-10-CM

## 2014-01-14 ENCOUNTER — Other Ambulatory Visit: Payer: Self-pay | Admitting: Internal Medicine

## 2014-01-14 DIAGNOSIS — R928 Other abnormal and inconclusive findings on diagnostic imaging of breast: Secondary | ICD-10-CM

## 2014-01-20 ENCOUNTER — Ambulatory Visit
Admission: RE | Admit: 2014-01-20 | Discharge: 2014-01-20 | Disposition: A | Discharge status: Home / Self Care | Payer: No Typology Code available for payment source | Source: Ambulatory Visit | Attending: Internal Medicine | Admitting: Internal Medicine

## 2014-01-20 DIAGNOSIS — R928 Other abnormal and inconclusive findings on diagnostic imaging of breast: Secondary | ICD-10-CM

## 2014-02-27 ENCOUNTER — Ambulatory Visit: Payer: No Typology Code available for payment source | Admitting: Internal Medicine

## 2014-04-09 ENCOUNTER — Ambulatory Visit: Payer: No Typology Code available for payment source

## 2014-04-09 VITALS — BP 107/71 | HR 65 | Temp 97.9°F | Resp 16

## 2014-04-09 MED ORDER — IBUPROFEN 600 MG PO TABS: 600.0000 mg | ORAL_TABLET | Freq: Three times a day (TID) | ORAL | Status: DC | PRN

## 2014-04-09 MED ORDER — DIPHENHYDRAMINE HCL 25 MG PO TABS: 25.0000 mg | ORAL_TABLET | Freq: Four times a day (QID) | ORAL | Status: DC | PRN

## 2014-04-09 MED ORDER — FLUTICASONE PROPIONATE 50 MCG/ACT NA SUSP: 2.0000 | Freq: Every day | NASAL | Status: DC

## 2014-04-09 MED ORDER — CETIRIZINE HCL 10 MG PO TABS
10.0000 mg | ORAL_TABLET | Freq: Every day | ORAL | Status: DC
Start: 2014-04-09 — End: 2014-11-06

## 2014-04-09 NOTE — Progress Notes (Unsigned)
Patient presents to walk in clinic with c/o stuffy nose, throat pain, and headache. Patient states her sore throat started the day before yesterday and now it hurts a lot. Patient denies coughing up any phlegm. Patient has not tried any over the counter methods. Patient does have sinus tenderness on palpation and c/o sneezing. Rapid strep was negative. Consulted with Dr. Hyman HopesJegede. Verbal order received for Benadryl, Ibuprofen, Claritin, and Flonase. Informed patient via telephone interpreter the plan of care. Patient verbalized understanding. Patient informed if she starts to have continuous yellow or green phlegm or symptoms persist or worsen after one week to return to the office. Patient verbalized understanding.  Annamaria Hellingose,Ronon Ferger Renee, RN

## 2014-04-09 NOTE — Patient Instructions (Signed)
If you start coughing up green or yellow mucus return to the office. If symptoms persist or worsen after one week return to the office.

## 2014-04-11 LAB — CULTURE, GROUP A STREP: Organism ID, Bacteria: NORMAL

## 2014-11-06 ENCOUNTER — Ambulatory Visit (INDEPENDENT_AMBULATORY_CARE_PROVIDER_SITE_OTHER): Payer: Self-pay | Admitting: Physician Assistant

## 2014-11-06 VITALS — BP 102/64 | HR 62 | Temp 98.3°F | Resp 16 | Ht 63.0 in | Wt 211.0 lb

## 2014-11-06 DIAGNOSIS — H9202 Otalgia, left ear: Secondary | ICD-10-CM

## 2014-11-06 DIAGNOSIS — R070 Pain in throat: Secondary | ICD-10-CM

## 2014-11-06 DIAGNOSIS — R0981 Nasal congestion: Secondary | ICD-10-CM

## 2014-11-06 DIAGNOSIS — Z889 Allergy status to unspecified drugs, medicaments and biological substances status: Secondary | ICD-10-CM

## 2014-11-06 DIAGNOSIS — R05 Cough: Secondary | ICD-10-CM

## 2014-11-06 DIAGNOSIS — R059 Cough, unspecified: Secondary | ICD-10-CM

## 2014-11-06 DIAGNOSIS — Z9109 Other allergy status, other than to drugs and biological substances: Secondary | ICD-10-CM

## 2014-11-06 DIAGNOSIS — H7292 Unspecified perforation of tympanic membrane, left ear: Secondary | ICD-10-CM

## 2014-11-06 LAB — POCT RAPID STREP A (OFFICE): Rapid Strep A Screen: NEGATIVE

## 2014-11-06 MED ORDER — IPRATROPIUM BROMIDE 0.03 % NA SOLN: 2.0000 | Freq: Two times a day (BID) | NASAL | Status: DC

## 2014-11-06 MED ORDER — MAGIC MOUTHWASH W/LIDOCAINE: 5.0000 mL | Freq: Four times a day (QID) | ORAL | Status: AC | PRN

## 2014-11-06 MED ORDER — HYDROCOD POLST-CHLORPHEN POLST 10-8 MG/5ML PO LQCR: 5.0000 mL | Freq: Two times a day (BID) | ORAL | Status: AC | PRN

## 2014-11-06 MED ORDER — GUAIFENESIN ER 1200 MG PO TB12: 1.0000 | ORAL_TABLET | Freq: Two times a day (BID) | ORAL | Status: AC | PRN

## 2014-11-06 MED ORDER — AMOXICILLIN-POT CLAVULANATE 875-125 MG PO TABS: 1.0000 | ORAL_TABLET | Freq: Two times a day (BID) | ORAL | Status: AC

## 2014-11-06 MED ORDER — CETIRIZINE HCL 10 MG PO TABS: 10.0000 mg | ORAL_TABLET | Freq: Every day | ORAL | Status: DC

## 2014-11-06 NOTE — Progress Notes (Signed)
[facility] [address], [place] [address] [phone]  Date:  11/06/2014   Name:  Deborah Vaughan   DOB:  1970-11-01   MRN:  [id number]  PCP:  Jeanann LewandowskyJEGEDE, OLUGBEMIGA, MD    Chief Complaint: Sore Throat; Cough; Back Pain; and Ear Pain   History of Present Illness:  Deborah Vaughan is a 44 y.o. very pleasant female patient who presents with the following: 2 weeks ago, symptoms of runny-nose, laryngitis, sore throat, nasal congestion, and ear pain began.  She became concerned yesterday when she started to have chills, and pain in her shoulders.  She also has temporal headaches.  She has Theraflu, but this has not helped.  She also has 3 days of burning pain in her stomach, but a normal appetite.  She is hydrating well.  She denies diarrhea.     Patient has sinus hx reports being followed by ENT.  She reports that it was recommended to have surgery.  She and son report it was for tonsillectomy, but she is waiting for financial reasons.  Patient Active Problem List   Diagnosis Date Noted  . Acute pharyngitis 11/21/2013  . Periodic health assessment, general screening, adult 11/21/2013    History reviewed. No pertinent past medical history.  Past Surgical History  Procedure Laterality Date  . Cesarean section      History  Substance Use Topics  . Smoking status: Never Smoker   . Smokeless tobacco: Not on file  . Alcohol Use: No    Family History  Problem Relation Age of Onset  . Cancer Father   . Cancer Brother   . Diabetes Brother     No Known Allergies  Medication list has been reviewed and updated.  Current Outpatient Prescriptions on File Prior to Visit  Medication Sig Dispense Refill  . amoxicillin (AMOXIL) 500 MG capsule Take 1 capsule (500 mg total) by mouth 3 (three) times daily. X 10days 30 capsule 1  . amoxicillin-clavulanate (AUGMENTIN) 875-125 MG per tablet Take 1 tablet by mouth 2 (two) times daily.    . benzonatate  (TESSALON) 100 MG capsule Take 100 mg by mouth 3 (three) times daily as needed for cough.    . cetirizine (ZYRTEC) 10 MG tablet Take 1 tablet (10 mg total) by mouth daily. 30 tablet 11  . diphenhydrAMINE (BENADRYL) 25 MG tablet Take 1 tablet (25 mg total) by mouth every 6 (six) hours as needed. 30 tablet 0  . fluticasone (FLONASE) 50 MCG/ACT nasal spray Place 2 sprays into both nostrils daily. 16 g 6  . HYDROcodone-homatropine (HYCODAN) 5-1.5 MG/5ML syrup Take 5 mLs by mouth every 6 (six) hours as needed for cough. 30 mL 0  . ibuprofen (ADVIL,MOTRIN) 600 MG tablet Take 1 tablet (600 mg total) by mouth every 8 (eight) hours as needed. 30 tablet 0  . loratadine (CLARITIN) 10 MG tablet Take 1 tablet (10 mg total) by mouth daily. 30 tablet 1   No current facility-administered medications on file prior to visit.    Review of Systems: ROS otherwise unremarkable unless listed above.    Physical Examination: Filed Vitals:   11/06/14 1126  BP: 102/64  Pulse: 62  Temp: 98.3 F (36.8 C)  Resp: 16   Filed Vitals:   11/06/14 1126  Height: 5\' 3"  (1.6 m)  Weight: 211 lb (95.709 kg)   Body mass index is 37.39 kg/(m^2). Ideal Body Weight: Weight in (lb) to have BMI = 25: 140.8  Physical Exam  Constitutional: She is oriented to person, place, and time. She appears well-developed and well-nourished. No distress.  HENT:  Head: Normocephalic and atraumatic.  Right Ear: External ear and ear canal normal. Tympanic membrane is not injected, not perforated, not erythematous and not bulging.  Left Ear: External ear and ear canal normal. Tympanic membrane is perforated (Smooth-edged perforation at 8 o'clock without erythema or drainage.  ). Tympanic membrane is not injected, not erythematous and not bulging.  Nose: Mucosal edema and rhinorrhea present. Right sinus exhibits no maxillary sinus tenderness and no frontal sinus tenderness. Left sinus exhibits no maxillary sinus tenderness and no frontal sinus  tenderness.  Mouth/Throat: Mucous membranes are normal. Uvula swelling present. Posterior oropharyngeal edema and posterior oropharyngeal erythema present. No oropharyngeal exudate.  Eyes: EOM are normal. Pupils are equal, round, and reactive to light.  Neck: Normal range of motion. No thyromegaly present.  Cardiovascular: Normal rate, regular rhythm and normal heart sounds.  Exam reveals no gallop and no friction rub.   No murmur heard. Pulmonary/Chest: Effort normal and breath sounds normal. No respiratory distress. She has no wheezes.  Lymphadenopathy:       Head (right side): Submental and tonsillar adenopathy present. No submandibular, no preauricular, no posterior auricular and no occipital adenopathy present.       Head (left side): Tonsillar adenopathy present. No submental, no submandibular, no preauricular, no posterior auricular and no occipital adenopathy present.       Right: No supraclavicular adenopathy present.       Left: No supraclavicular adenopathy present.  Neurological: She is alert and oriented to person, place, and time.  Skin: Skin is warm and dry. She is not diaphoretic.  Psychiatric: She has a normal mood and affect. Her behavior is normal.    Results for orders placed or performed in visit on 11/06/14  POCT rapid strep A  Result Value Ref Range   Rapid Strep A Screen Negative Negative   Assessment and Plan: 44 year old female is here for chief complaint of ear pain, sore throat, and ear pain. -Strep culture placed -Treating with Augmentin for upper respiratory infection of bacterial etiology with prolonged symptoms. -Referral for ENT appreciated at this time for her allergies, ear perforation, reports of chronic upper respiratory infection -Advised to take zyrtec continuously to treat multiple allergies  Throat pain in adult - Plan: POCT rapid strep A, Culture, Group A Strep, amoxicillin-clavulanate (AUGMENTIN) 875-125 MG per tablet, Alum & Mag  Hydroxide-Simeth (MAGIC MOUTHWASH W/LIDOCAINE) SOLN  Nasal congestion - Plan: Guaifenesin (MUCINEX MAXIMUM STRENGTH) 1200 MG TB12, ipratropium (ATROVENT) 0.03 % nasal spray  Multiple allergies - Plan: cetirizine (ZYRTEC) 10 MG tablet  Cough - Plan: chlorpheniramine-HYDROcodone (TUSSIONEX PENNKINETIC ER) 10-8 MG/5ML Mare Ferrari  Trena Platt, PA-C [facility] 2/19/201610:14 PM

## 2014-11-06 NOTE — Patient Instructions (Addendum)
Drink plenty of water (64oz per day).   Continue to take the zyrtec after your symptoms resolve.  You may have to switch between allegra, zyrtec, and claritin every 3-4 months if they seem to not work for you. Please return if your symptoms do not improve after 48 hours. Infeccin de las vas areas superiores en los adultos (Upper Respiratory Infection, Adult)  La infeccin respiratoria de las vas areas superiores se conoce tambin como resfro comn. Las vas areas superiores Baxter Internationalincluyen los senos nasales, la garganta, la trquea, y los bronquios. Los bronquios son las vas areas que conducen el aire a los pulmones. La mayor parte de las personas mejora luego de una Cayugasemana, Biomedical engineerpero los sntomas pueden durar The Interpublic Group of Companieshasta dos semanas. La tos residual puede durar ms. CAUSAS Varios tipos de virus pueden causar la infeccin de los tejidos que cubren las vas areas superiores. Los tejidos se irritan y se inflaman y se originan secreciones. Tambin es frecuente la produccin de moco. El resfro es contagioso. El virus se disemina fcilmente a otras personas por contacto oral. Aqu se incluyen los besos, el compartir un vaso y el toser o Engineering geologistestornudar. Tambin puede diseminarse tocndose la boca o la Portugalnariz y luego tocando una superficie que luego tocan Economistotras personas.  SNTOMAS Los sntomas se desarrollan entre uno y Hernandezlandtres das luego de Cytogeneticistentrar en contacto con el virus. Pueden variar de Neomia Dearuna persona a otra. Incluyen:  Secrecin nasal.  Estornudos  Congestin nasal.  Irritacin de los senos nasales.  Dolor de Advertising copywritergarganta.  Prdida de la voz (laringitis).  Tos.  Fatiga.  Dolores musculares.  Prdida del apetito.  Dolor de Turkmenistancabeza.  Fiebre no muy elevada. DIAGNSTICO Puede diagnosticarse a s mismo la infeccin respiratoria, segn los sntomas habituales, ya que la mayor parte de las personas se resfra dos o tres veces al ao. El profesional puede confirmarlo basndose en el examen fsico. Lo ms  importante es que el profesional verifique que los sntomas no se deben a otra enfermedad como anginas, sinusitis, neumona, asma o epiglotitis. Para diagnosticar el resfrio comn, no es necesario que haga anlisis de Florissantsangre, pruebas en la garganta o radiografas, pero en algunos casos puede ser de utilidad para excluir otros problemas ms graves. El mdico decidir si necesita otras pruebas. RIESGOS Y COMPLICACIONES Tendr mayor riesgo de sufrir un resfro grave si consume cigarrillos, sufre una enfermedad cardaca (como insuficiencia cardaca) o pulmonar crnica (como asma) o si tiene un debilitamiento del sistema inmunolgico. Las personas muy jvenes o muy mayores tienen riesgo de sufrir infecciones ms graves. La sinusitis bacteriana, las infecciones del odo medio y la neumona bacteriana pueden complicar el resfro comn. El resfro puede exacerbar el asma y la enfermedad pulmonar obstructiva crnica. En algunos casos estas complicaciones requieren la atencin en un servicio de emergencias y pueden poner en peligro la vida. PREVENCIN La mejor manera de protegerse para no contraer un resfro es Pharmacologistmantener una buena higiene. Evite el contacto bucal o de las manos con personas con sntomas de resfro. Si se produce el contacto, lvese las manos con frecuencia. No hay pruebas firmes que indiquen que la vitamina C, la vitamina E, la equincea o la actividad fsica reduzcan las posibilidades de tener una infeccin. Sin embargo, siempre se recomienda Insurance account managerdescansar mucho y Winferd Humphreytener una buena nutricin. TRATAMIENTO El tratamiento est dirigido a Consulting civil engineeraliviar los sntomas. Esta enfermedad no tiene [place]. Los antibiticos no son eficaces, ya que esta infeccin la causa un virus y no una bacteria. El tratamiento incluye:  Aumente la ingesta de lquidos. Consumo de bebidas deportivas, que proporcionan electrolitos,azcares e hidratacin.  Inhale vapor caliente (de un vaporizador o de la ducha).  Tomar sopa de pollo u otros  lquidos claros, y [employer] Noble buena nutricin.  Descanse lo suficiente.  Haga grgaras o coma pastillas para Altria Group.  Control de la fiebre con ibuprofeno o acetaminofen, segn las indicaciones del mdico.  Aumento del uso del inhalador, si sufre asma. Las pastillas y los geles de zinc durante las primeras 24 horas de iniciado el resfro comn, pueden disminuir la duracin y Paramedic la gravedad de los sntomas. Los medicamentos para Chief Technology Officer pueden disminuir la fiebre, Paramedic los dolores musculares y Chief Technology Officer de Advertising copywriter. Se dispone de una gran variedad de medicamentos de venta libre para tratar la congestin y la secrecin nasal. El profesional podr recomendarle inhalantes para los otros sntomas. INSTRUCCIONES PARA EL CUIDADO DOMICILIARIO  Utilice los medicamentos de venta libre o de prescripcin para Chief Technology Officer, el malestar o la [place], segn se lo indique el profesional que lo asiste.  Utilice un vaporizador caliente o inhale vapor, haciendo salir agua de la ducha para aumentar la humedad [place]. Esto mantendr las secreciones hmedas y Community education officer ms fcil respirar.  Beba gran cantidad de lquido para mantener la orina de tono claro o color amarillo plido.  Descanse todo lo que pueda.  Regrese a su trabajo cuando la temperatura se haya normalizado, o cuando el profesional que lo asiste se lo indique. Quizs sea necesario que permanezca en su casa durante un tiempo ms prolongado para Buyer, retail a Economist. Tambin puede utilizar un barbijo y ser cuidadoso con el lavado de manos para evitar la diseminacin del virus. SOLICITE ATENCIN MDICA SI:  Luego de los primeros das siente que empeora en vez de [place].  Necesita que Occupational psychologist brinde ms informacin relacionada con los medicamentos para [employer] sntomas.  Siente escalofros, le falta el aire o escupe moco de color marrn o rojo. Estos pueden ser sntomas de neumona.  Tiene una  secrecin nasal de color amarillo o marrn, o siente dolor en el rostro, especialmente cuando se inclina hacia adelante. Estos pueden ser sntomas de sinusitis.  Tiene fiebre, siente el cuello hinchado, tiene dolor al tragar u observa manchas blancas en el fondo de la garganta. Estos pueden ser sntomas de angina por estreptococo. Romona Curls ATENCIN MDICA DE INMEDIATO SI:  Lance Muss.  Comienza a sentir Herbalist de cabeza intenso o persistente, dolor de odos, en el seno nasal o en el pecho.  Tiene tos y esta se prolonga demasiado, tose y escupe sangre, la mucosidad habitual se modifica (si tiene una enfermedad pulmonar crnica) o respira con dificultad.  Siente rigidez en el cuello o dolor de cabeza intenso. Document Released: 06/14/2005 Document Revised: 11/27/2011 [facility] Patient Information 2015 [place], [place]. This information is not intended to replace advice given to you by your health care provider. Make sure you discuss any questions you have with your health care provider.

## 2014-11-08 LAB — CULTURE, GROUP A STREP: ORGANISM ID, BACTERIA: NORMAL

## 2014-11-10 ENCOUNTER — Encounter: Payer: Self-pay | Admitting: Family Medicine

## 2014-12-24 ENCOUNTER — Ambulatory Visit (INDEPENDENT_AMBULATORY_CARE_PROVIDER_SITE_OTHER): Payer: Self-pay | Admitting: Family Medicine

## 2014-12-24 VITALS — BP 92/68 | HR 70 | Temp 97.5°F | Resp 16 | Ht 63.0 in | Wt 211.0 lb

## 2014-12-24 DIAGNOSIS — J0110 Acute frontal sinusitis, unspecified: Secondary | ICD-10-CM

## 2014-12-24 DIAGNOSIS — H9192 Unspecified hearing loss, left ear: Secondary | ICD-10-CM

## 2014-12-24 DIAGNOSIS — J309 Allergic rhinitis, unspecified: Secondary | ICD-10-CM

## 2014-12-24 DIAGNOSIS — H7292 Unspecified perforation of tympanic membrane, left ear: Secondary | ICD-10-CM

## 2014-12-24 DIAGNOSIS — R51 Headache: Secondary | ICD-10-CM

## 2014-12-24 DIAGNOSIS — R519 Headache, unspecified: Secondary | ICD-10-CM

## 2014-12-24 LAB — POCT CBC
GRANULOCYTE PERCENT: 44.5 % (ref 37–80)
HEMATOCRIT: 39.5 % (ref 37.7–47.9)
Hemoglobin: 12.5 g/dL (ref 12.2–16.2)
LYMPH, POC: 3.3 (ref 0.6–3.4)
MCH, POC: 24 pg — AB (ref 27–31.2)
MCHC: 31.5 g/dL — AB (ref 31.8–35.4)
MCV: 76.2 fL — AB (ref 80–97)
MID (cbc): 0.5 (ref 0–0.9)
MPV: 9.1 fL (ref 0–99.8)
PLATELET COUNT, POC: 274 10*3/uL (ref 142–424)
POC GRANULOCYTE: 3 (ref 2–6.9)
POC LYMPH PERCENT: 47.8 %L (ref 10–50)
POC MID %: 7.7 %M (ref 0–12)
RBC: 5.19 M/uL (ref 4.04–5.48)
RDW, POC: 22.5 %
WBC: 6.8 10*3/uL (ref 4.6–10.2)

## 2014-12-24 LAB — POCT SEDIMENTATION RATE: POCT SED RATE: 8 mm/hr (ref 0–22)

## 2014-12-24 MED ORDER — AMOXICILLIN-POT CLAVULANATE 875-125 MG PO TABS: 1.0000 | ORAL_TABLET | Freq: Two times a day (BID) | ORAL | Status: DC

## 2014-12-24 NOTE — Progress Notes (Addendum)
Subjective:  This chart was scribed for Merri Ray, MD by Molli Posey, Medical scribe. This patient was seen in ROOM 9 and the patient's care was started 10:47 AM.  Patient ID: Deborah Vaughan, female    DOB: July 14, 1971, 44 y.o.   MRN: [id number]   Chief Complaint  Patient presents with  . Neck and head pain    Onset 3 weeks progressively getting worse   HPI HPI Comments: Deborah Vaughan is a 44 y.o. female who presents to [facility] complaining of gradually worsening, intermittent neck and head pain for the last 3 weeks. Pt complains of generalized head pain that travels down the back of her neck. She states she is still having trouble hearing out of her left ear due to an infection a few weeks ago. Pt was treated with Augmentin on 11/06/14 for a perforated left TM. Pt was referred to ENT but has not seen them at this time because they cancelled the referal. She denies any falls or injuries. Her son states that she has taken no medications for her symptoms or tried using ice or heat. Pt states that she has not done any new activities at work at [employer] a. She denies any new stressors or problems at home or work. She denies fever, changes in vision, nasal congestion, nausea or vomiting.  Patient Active Problem List   Diagnosis Date Noted  . Acute pharyngitis 11/21/2013  . Periodic health assessment, general screening, adult 11/21/2013   No past medical history on file. Past Surgical History  Procedure Laterality Date  . Cesarean section     No Known Allergies Prior to Admission medications   Medication Sig Start Date End Date Taking? Authorizing Provider  cetirizine (ZYRTEC) 10 MG tablet Take 1 tablet (10 mg total) by mouth daily. 11/06/14  Yes Stephanie D English, PA  ipratropium (ATROVENT) 0.03 % nasal spray Place 2 sprays into both nostrils 2 (two) times daily. 11/06/14  Yes Stephanie D English, PA  ibuprofen (ADVIL,MOTRIN) 600 MG tablet Take 1 tablet (600 mg total) by  mouth every 8 (eight) hours as needed. Patient not taking: Reported on 12/24/2014 04/09/14   Tresa Garter, MD   History   Social History  . Marital Status: Single    Spouse Name: N/A  . Number of Children: N/A  . Years of Education: N/A   Occupational History  . Not on file.   Social History Main Topics  . Smoking status: Never Smoker   . Smokeless tobacco: Not on file  . Alcohol Use: No  . Drug Use: No  . Sexual Activity: Not on file   Other Topics Concern  . Not on file   Social History Narrative   Review of Systems  Constitutional: Negative for fever.  HENT: Negative for congestion and sinus pressure.   Eyes: Negative for visual disturbance.  Gastrointestinal: Negative for nausea and vomiting.  Musculoskeletal: Positive for neck pain.  Neurological: Positive for headaches.     Objective:   Physical Exam  Constitutional: She is oriented to person, place, and time. She appears well-developed and well-nourished. No distress.  HENT:  Head: Normocephalic and atraumatic.  Right Ear: Hearing, tympanic membrane, external ear and ear canal normal.  Left Ear: Hearing, tympanic membrane, external ear and ear canal normal.  Nose: Nose normal.  Mouth/Throat: Oropharynx is clear and moist. No oropharyngeal exudate.  Appears to have 2 perforations in left TM at approximately 4 o'clock and 9 o'clock. No active discharge. Jaw is non  tender, mastoid non tender. No lymphadenopathy. Slight frontal sinus tenderness bilaterally. Maxillary sinuses non tender. Pain more occipital, tender along palpation of occipitatoin scalp as well as bitemporal.   Eyes: Conjunctivae and EOM are normal. Pupils are equal, round, and reactive to light.  Neck: Normal range of motion.  C-spine non tender, pain free ROM.   Cardiovascular: Normal rate, regular rhythm, normal heart sounds and intact distal pulses.   No murmur heard. Pulmonary/Chest: Effort normal and breath sounds normal. No respiratory  distress. She has no wheezes. She has no rhonchi.  Neurological: She is alert and oriented to person, place, and time.  Negative romberg. No pronator drift. Normal heel to toe. Normal finger to nose.   Skin: Skin is warm and dry. No rash noted.  Psychiatric: She has a normal mood and affect. Her behavior is normal.  Nursing note and vitals reviewed.  Filed Vitals:   12/24/14 0935  BP: 92/68  Pulse: 70  Temp: 97.5 F (36.4 C)  TempSrc: Oral  Resp: 16  Height: '5\' 3"'  (1.6 m)  Weight: 211 lb (95.709 kg)  SpO2: 96%      Assessment & Plan:  *At end of visit - asked about allergy medication for pollen. Could have some component of allergies. Not taking meds for this at this time. See information below.   Deborah Vaughan is a 44 y.o. female Perforated eardrum, left - Plan: Ambulatory referral to ENT, POCT CBC, POCT SEDIMENTATION RATE, Hearing difficulty of left ear - Plan: Ambulatory referral to ENT, POCT CBC, POCT SEDIMENTATION RATE  -refer to ENT.   Headache, unspecified headache type - Plan: POCT CBC, POCT SEDIMENTATION RATE Acute frontal sinusitis, recurrence not specified - Plan: amoxicillin-clavulanate (AUGMENTIN) 875-125 MG per tablet Allergic rhinitis, unspecified allergic rhinitis type  - suspected underlying allergic rhinitis, secondary sinusitis. Doubt temporal arteritis, with normal ESR.  - start augmentin, sx care, rtc precautions.    Language barrier - spanish spoken, understanding expressed.    Meds ordered this encounter  Medications  . amoxicillin-clavulanate (AUGMENTIN) 875-125 MG per tablet    Sig: Take 1 tablet by mouth 2 (two) times daily.    Dispense:  20 tablet    Refill:  0   Patient Instructions  Tome antibiotica 2 veces cada dia por infeccion en sinus.  por alergias - zyrtec o allegra una pastilla cada dia. Flonase si necesario si pastillas no ayudale.  Ibuprofen o tylenol si necesario por dolor de cabeza. voy a Production assistant, radio otro prueba de  inflamaccion. si esta abnormal, voy a llamarle.  voy a referir a specialista de oido en proxima 2 semanas.  Regrese aqui o cuarto de emergencia si simptomas empeorse.   Return to the clinic or go to the nearest emergency room if any of your symptoms worsen or new symptoms occur.  Sinusitis (Sinusitis) La sinusitis es el enrojecimiento, el dolor y la inflamacin de los senos paranasales. Los senos paranasales son bolsas de aire que se encuentran dentro de los huesos de la cara (por debajo de los ojos, en la mitad de la frente o por encima de los ojos). En los senos paranasales sanos, el moco puede drenar y el aire circula a travs de ellos en su camino hacia la Lawyer. Sin embargo, cuando se [place], el moco y el aire [place]. Esto hace que se desarrollen bacterias y otros grmenes que causan infeccin. La sinusitis puede desarrollarse rpidamente y durar solo un tiempo corto (aguda) o continuar por un perodo largo (crnica). La  sinusitis que dura ms de 12 semanas se considera crnica.  CAUSAS  Las causas de la sinusitis son:  Cualquier alergia que tenga.  Las [employer], como el desplazamiento del cartlago que separa las fosas nasales (desvo del tabique), que pueden disminuir el flujo de aire por la nariz y los senos paranasales, y Print production planner su drenaje.  Las anomalas funcionales, como cuando los pequeos pelos (cilias) que se encuentran en los senos paranasales y que ayudan a eliminar el moco no funcionan correctamente o no estn presentes. Forgan sntomas de la sinusitis aguda y [place] son los mismos. Los sntomas principales son Conservation officer, historic buildings y la presin alrededor de los senos paranasales afectados. Otros sntomas son:  Chief of Staff.  Dolor de odos.  Dolor de [place].  Mal aliento.  Disminucin del sentido del olfato y del gusto.  Tos, que empeora al [employer]ley[employer].  Fatiga.  Cristy Hilts.  Drenaje de moco espeso por la nariz, que  generalmente es de color verde y puede contener pus (purulento).  Hinchazn y calor en los senos paranasales afectados. DIAGNSTICO  Su mdico le Chartered certified accountant examen fsico. Durante el examen, el mdico:  Le revisar la nariz para buscar signos de crecimientos anormales en las fosas nasales (plipos nasales).  Palpar los senos paranasales afectados para buscar signos de infeccin.  Ver el interior de los senos paranasales (endoscopia) a travs de un dispositivo de obtencin de imgenes que tiene una luz conectada (endoscopio). Si el mdico sospecha que usted sufre sinusitis crnica, podr indicar una o ms de las siguientes pruebas:  Pruebas de Buyer, retail.  Cultivo de las [employer]. Se extrae Truddie Coco de moco de la nariz, que se enva al laboratorio para detectar bacterias.  Citologa nasal. Se extrae Truddie Coco de moco de la nariz, que el mdico examina para determinar si la sinusitis est relacionada con Obie Dredge. TRATAMIENTO  La mayora de los casos de sinusitis aguda se deben a una infeccin viral y se resuelven espontneamente en un perodo de 10das. En algunos casos, se recetan medicamentos para [employer] (analgsicos, descongestivos, aerosoles nasales con corticoides o aerosoles salinos).  Sin embargo, para la sinusitis por infeccin bacteriana, Camera operator. Los antibiticos son medicamentos que destruyen las bacterias que causan la infeccin.  Con poca frecuencia, la sinusitis tiene su origen en una infeccin por hongos. En estos casos, el mdico recetar un medicamento antimictico. Para algunos casos de sinusitis crnica, es necesario someterse a [place]. Generalmente, se trata de Navistar International Corporation la sinusitis se repite ms de 3veces al ao, a pesar de otros tratamientos. INSTRUCCIONES PARA EL CUIDADO EN EL HOGAR   Beber gran cantidad de lquidos. Los lquidos ayudan a Saks Incorporated, para que drene ms fcilmente de los  senos paranasales.  Use un humidificador.  Inhale vapor de 3 a 4 veces al da (por ejemplo, sintese en el bao con la ducha abierta).  Aplquese un pao tibio y hmedo en la cara 3 o 4 veces [place] indicaciones del mdico.  Use un aerosol nasal salino para ayudar a Air cabin crew y [employer]PER[employer] INC senos nasales.  Tome los medicamentos solamente como se lo haya indicado el mdico.  Si le recetaron un antimictico o un antibitico, asegrese de terminarlos, incluso si comienza a sentirse mejor. SOLICITE ATENCIN MDICA DE INMEDIATO SI:  Siente ms dolor o sufre dolores de cabeza intensos.  Tiene nuseas, vmitos o somnolencia.  Observa hinchazn alrededor  del rostro.  Tiene problemas de visin.  Presenta rigidez en el cuello.  Tiene dificultad para respirar. ASEGRESE DE QUE:   Comprende estas instrucciones.  Controlar su afeccin.  Recibir ayuda de inmediato si no mejora o si empeora. Document Released: 06/14/2005 Document Revised: 01/19/2014 [facility] Patient Information 2015 [place]. This information is not intended to replace advice given to you by your health care provider. Make sure you discuss any questions you have with your health care provider.     I personally performed the services described in this documentation, which was scribed in my presence. The recorded information has been reviewed and considered, and addended by me as needed.

## 2014-12-24 NOTE — Patient Instructions (Addendum)
Tome antibiotica 2 veces cada dia por infeccion en sinus.  por alergias - zyrtec o allegra una pastilla cada dia. Flonase si necesario si pastillas no ayudale.  Ibuprofen o tylenol si necesario por dolor de cabeza. voy a Solicitor otro prueba de inflamaccion. si esta abnormal, voy a llamarle.  voy a referir a specialista de oido en proxima 2 semanas.  Regrese aqui o cuarto de emergencia si simptomas empeorse.   Return to the clinic or go to the nearest emergency room if any of your symptoms worsen or new symptoms occur.  Sinusitis (Sinusitis) La sinusitis es el enrojecimiento, el dolor y la inflamacin de los senos paranasales. Los senos paranasales son bolsas de aire que se encuentran dentro de los huesos de la cara (por debajo de los ojos, en la mitad de la frente o por encima de los ojos). En los senos paranasales sanos, el moco puede drenar y el aire circula a travs de ellos en su camino hacia la Clinical cytogeneticist. Sin embargo, cuando se [place], el moco y el aire [place]. Esto hace que se desarrollen bacterias y otros grmenes que causan infeccin. La sinusitis puede desarrollarse rpidamente y durar solo un tiempo corto (aguda) o continuar por un perodo largo (crnica). La sinusitis que dura ms de 12 semanas se considera crnica.  CAUSAS  Las causas de la sinusitis son:  Cualquier alergia que tenga.  Las [employer], como el desplazamiento del cartlago que separa las fosas nasales (desvo del tabique), que pueden disminuir el flujo de aire por la nariz y los senos paranasales, y Audiological scientist su drenaje.  Las anomalas funcionales, como cuando los pequeos pelos (cilias) que se encuentran en los senos paranasales y que ayudan a eliminar el moco no funcionan correctamente o no estn presentes. SIGNOS Y SNTOMAS  Los sntomas de la sinusitis aguda y [place] son los mismos. Los sntomas principales son Chief Technology Officer y la presin alrededor de los senos paranasales afectados. Otros  sntomas son:  Careers information officer.  Dolor de odos.  Dolor de [place].  Mal aliento.  Disminucin del sentido del olfato y del gusto.  Tos, que empeora al [employer].  Fatiga.  Grant Ruts.  Drenaje de moco espeso por la nariz, que generalmente es de color verde y puede contener pus (purulento).  Hinchazn y calor en los senos paranasales afectados. DIAGNSTICO  Su mdico le Medical sales representative examen fsico. Durante el examen, el mdico:  Le revisar la nariz para buscar signos de crecimientos anormales en las fosas nasales (plipos nasales).  Palpar los senos paranasales afectados para buscar signos de infeccin.  Ver el interior de los senos paranasales (endoscopia) a travs de un dispositivo de obtencin de imgenes que tiene una luz conectada (endoscopio). Si el mdico sospecha que usted sufre sinusitis crnica, podr indicar una o ms de las siguientes pruebas:  Pruebas de Programmer, multimedia.  Cultivo de las Yahoo. Se extrae Lauris Poag de moco de la nariz, que se enva al laboratorio para detectar bacterias.  Citologa nasal. Se extrae Lauris Poag de moco de la nariz, que el mdico examina para determinar si la sinusitis est relacionada con Vella Raring. TRATAMIENTO  La mayora de los casos de sinusitis aguda se deben a una infeccin viral y se resuelven espontneamente en un perodo de 10das. En algunos casos, se recetan medicamentos para [employer] (analgsicos, descongestivos, aerosoles nasales con corticoides o aerosoles salinos).  Sin embargo, para la sinusitis por infeccin bacteriana, Chief Operating Officer. Los antibiticos  son medicamentos que destruyen las bacterias que causan la infeccin.  Con poca frecuencia, la sinusitis tiene su origen en una infeccin por hongos. En estos casos, el mdico recetar un medicamento antimictico. Para algunos casos de sinusitis crnica, es necesario someterse a [place]una ciruga. Generalmente, se trata  de Engelhard Corporationcasos en los que la sinusitis se repite ms de 3veces al ao, a pesar de otros tratamientos. INSTRUCCIONES PARA EL CUIDADO EN EL HOGAR   Beber gran cantidad de lquidos. Los lquidos ayudan a Dillard'sdisolver el moco, para que drene ms fcilmente de los senos paranasales.  Use un humidificador.  Inhale vapor de 3 a 4 veces al da (por ejemplo, sintese en el bao con la ducha abierta).  Aplquese un pao tibio y hmedo en la cara 3 o 4 veces al da o segn las indicaciones del mdico.  Use un aerosol nasal salino para ayudar a Environmental education officerhumedecer y Duke Energylimpiar los senos nasales.  Tome los medicamentos solamente como se lo haya indicado el mdico.  Si le recetaron un antimictico o un antibitico, asegrese de terminarlos, incluso si comienza a sentirse mejor. SOLICITE ATENCIN MDICA DE INMEDIATO SI:  Siente ms dolor o sufre dolores de cabeza intensos.  Tiene nuseas, vmitos o somnolencia.  Observa hinchazn alrededor del rostro.  Tiene problemas de visin.  Presenta rigidez en el cuello.  Tiene dificultad para respirar. ASEGRESE DE QUE:   Comprende estas instrucciones.  Controlar su afeccin.  Recibir ayuda de inmediato si no mejora o si empeora. Document Released: 06/14/2005 Document Revised: 01/19/2014 [facility] Patient Information 2015 [place], [place]. This information is not intended to replace advice given to you by your health care provider. Make sure you discuss any questions you have with your health care provider.

## 2015-01-07 ENCOUNTER — Other Ambulatory Visit: Payer: Self-pay | Admitting: Otolaryngology

## 2015-01-07 DIAGNOSIS — H9202 Otalgia, left ear: Secondary | ICD-10-CM

## 2015-01-07 DIAGNOSIS — H7202 Central perforation of tympanic membrane, left ear: Secondary | ICD-10-CM

## 2015-01-07 DIAGNOSIS — H9192 Unspecified hearing loss, left ear: Secondary | ICD-10-CM

## 2015-01-13 ENCOUNTER — Ambulatory Visit
Admission: RE | Admit: 2015-01-13 | Discharge: 2015-01-13 | Disposition: A | Discharge status: Home / Self Care | Payer: No Typology Code available for payment source | Source: Ambulatory Visit | Attending: Otolaryngology | Admitting: Otolaryngology

## 2015-01-13 DIAGNOSIS — H9202 Otalgia, left ear: Secondary | ICD-10-CM

## 2015-01-13 DIAGNOSIS — H7202 Central perforation of tympanic membrane, left ear: Secondary | ICD-10-CM

## 2015-01-13 DIAGNOSIS — H9192 Unspecified hearing loss, left ear: Secondary | ICD-10-CM

## 2015-09-22 ENCOUNTER — Ambulatory Visit (INDEPENDENT_AMBULATORY_CARE_PROVIDER_SITE_OTHER): Payer: Self-pay

## 2015-09-22 ENCOUNTER — Ambulatory Visit (INDEPENDENT_AMBULATORY_CARE_PROVIDER_SITE_OTHER): Payer: Self-pay | Admitting: Urgent Care

## 2015-09-22 VITALS — BP 120/80 | HR 75 | Temp 98.2°F | Resp 16 | Ht 63.0 in | Wt 164.0 lb

## 2015-09-22 DIAGNOSIS — S8002XA Contusion of left knee, initial encounter: Secondary | ICD-10-CM

## 2015-09-22 DIAGNOSIS — S82002A Unspecified fracture of left patella, initial encounter for closed fracture: Secondary | ICD-10-CM

## 2015-09-22 DIAGNOSIS — M25562 Pain in left knee: Secondary | ICD-10-CM

## 2015-09-22 DIAGNOSIS — S8992XA Unspecified injury of left lower leg, initial encounter: Secondary | ICD-10-CM

## 2015-09-22 DIAGNOSIS — W19XXXA Unspecified fall, initial encounter: Secondary | ICD-10-CM

## 2015-09-22 MED ORDER — NAPROXEN SODIUM 550 MG PO TABS: 550.0000 mg | ORAL_TABLET | Freq: Two times a day (BID) | ORAL | Status: AC

## 2015-09-22 NOTE — Progress Notes (Signed)
    MRN: [id number] DOB: 1971-04-02  Subjective:   Deborah Vaughan is a 45 y.o. female presenting for chief complaint of Knee Injury and Leg Pain  Reports ~1.5 week history of left knee injury. Patient states that she slipped in her home and fell on her left knee. She has since had constant left knee pain, swelling, has had intermittent warmth and difficulty ambulating, bearing weight. Has not tried any medications for relief. Has tried soaking knee in salt water. 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 Denies hearing popping or tearing noises, bony deformity, redness, history of gout. Denies previous knee injury or knee surgeries.  Deborah Vaughan currently has no medications in their medication list. Also has No Known Allergies.  Deborah Vaughan  has no past medical history on file. Also  has past surgical history that includes Cesarean section.  Objective:   Vitals: BP 120/80 mmHg  Pulse 75  Temp(Src) 98.2 F (36.8 C) (Oral)  Resp 16  Ht 5\' 3"  (1.6 m)  Wt 164 lb (74.39 kg)  BMI 29.06 kg/m2  SpO2 96%  LMP 09/15/2015 (Approximate)  Physical Exam  Constitutional: She is oriented to person, place, and time. She appears well-developed and well-nourished.  Cardiovascular: Normal rate.   Pulmonary/Chest: Effort normal.  Musculoskeletal: She exhibits no edema.       Left knee: She exhibits decreased range of motion (full extension) and swelling (trace edema over patellar tendon, medial joint line). She exhibits no effusion, no ecchymosis, no deformity, no laceration, no erythema and normal patellar mobility. Tenderness (significant over area outlined) found.       Legs: Neurological: She is alert and oriented to person, place, and time.  Skin: Skin is warm and dry. No rash noted. No erythema. No pallor.   [facility] reading (PRIMARY) by  Dr. Neva SeatGreene and PA-Shyane Fossum. Left knee - Irregularity over lateral patella as seen in sunrise view, acute process versus degenerative  change.  Assessment and Plan :   1. Knee contusion, left, initial encounter 2. Knee injury, left, initial encounter 3. Left knee pain 4. Fall, initial encounter - Recommended conservative management with NSAID, reaction knee brace. Patient is to modify activity and rtc in 2 weeks if no improvement. - Radiologist over-read pending.  Wallis BambergMario Estephani Popper, PA-C [facility] [phone] 09/22/2015 10:24 AM    UPDATE:  Dg Knee Complete 4 Views Left  09/22/2015  CLINICAL DATA:  Fall 1 week ago.  Pain. EXAM: LEFT KNEE - COMPLETE 4+ VIEW COMPARISON:  No prior. FINDINGS: No evidence of knee joint effusion. Subtle fracture lateral aspect patella versus bipartite patella noted. No other focal abnormality identified. IMPRESSION: Subtle fracture lateral aspect patella versus bipartite patella . Electronically Signed   By: Maisie Fushomas  Register   On: 09/22/2015 11:00   Will call patient to offer referral to ortho or knee immobilizer for subtle fracture of left patella.  Wallis BambergMario Orry Sigl, PA-C [facility] [phone] 09/22/2015  11:49 AM

## 2015-09-22 NOTE — Addendum Note (Signed)
Addended by: Wallis BambergMANI, Chelsee Hosie on: 09/22/2015 12:28 PM   Modules accepted: Orders, SmartSet

## 2015-09-22 NOTE — Patient Instructions (Signed)
Dolor de rodilla (Knee Pain) El dolor de rodilla es un sntoma muy comn y puede tener muchas causas. Suele desaparecer cuando se siguen las indicaciones del mdico en lo que respecta a Engineer, materialsaliviar el dolor y las molestias en la casa. Sin embargo, puede Scientist, product/process developmentprogresar hasta convertirse en una afeccin que requiere Ulmertratamiento. Algunas afecciones pueden incluir lo siguiente:  Artritis por uso y Chiropractordesgaste (artrosis).  Artritis por hinchazn e irritacin (artritis reumatoide o gota).  Un quiste o un crecimiento en la rodilla.  Una infeccin en la articulacin de la rodilla.  Una lesin que no se [place]ub[place].  Dao, hinchazn o irritacin de los tejidos que sostienen la rodilla (distensin de ligamentos o tendinitis). Si el dolor de Paramedicrodilla persiste, tal vez haya que realizar ms estudios para Scientist, forensicdiagnosticar la afeccin, los cuales pueden incluir radiografas u otros estudios de diagnstico por imgenes de la rodilla. Adems, es posible que haya que extraerle lquido de la rodilla. El tratamiento del dolor continuo de rodilla depende de la causa, pero puede incluir lo siguiente:  Medicamentos para Engineer, materialsaliviar el dolor o reducir la hinchazn.  Inyecciones de corticoides en la rodilla.  Fisioterapia.  Ciruga. INSTRUCCIONES PARA EL CUIDADO EN EL HOGAR  Tome los medicamentos solamente como se lo haya indicado el mdico.  Mantenga la rodilla en reposo y en alto (elevada) mientras est descansando.  No haga cosas que le causen dolor o que lo intensifiquen.  Evite las actividades o los ejercicios de alto impacto, como correr, Public relations account executivesaltar la soga o hacer saltos de tijera.  Aplique hielo sobre la zona de la rodilla:  Ponga el hielo en una bolsa plstica.  Coloque una toalla entre la piel y la bolsa de hielo.  Coloque el hielo durante 20 minutos, 2 a 3 veces por da.  Pregntele al mdico si debe usar una Neurosurgeonrodillera elstica.  Cuando duerma, pngase una almohada debajo de la rodilla.  Baje de peso si es  necesario. El Bascopeso extra puede generar presin en la rodilla.  No consuma ningn producto que contenga tabaco, lo que incluye cigarrillos, tabaco de Theatre managermascar o Administrator, Civil Servicecigarrillos electrnicos. Si necesita ayuda para dejar de fumar, consulte al mdico. Fumar puede retrasar la curacin de cualquier problema que tenga en el hueso y Nurse, learning disabilityla articulacin. SOLICITE ATENCIN MDICA SI:  El dolor de rodilla contina, [place] o empeora.  Tiene fiebre junto con dolor de rodilla.  La rodilla se le tuerce o se le traba.  La rodilla est ms hinchada. SOLICITE ATENCIN MDICA DE INMEDIATO SI:   La articulacin de la rodilla est caliente al tacto.  Tiene dolor en el pecho o dificultad para respirar.   Esta informacin no tiene Theme park managercomo fin reemplazar el consejo del mdico. Asegrese de hacerle al mdico cualquier pregunta que tenga.   Document Released: 02/21/2008 Document Revised: 09/25/2014 Elsevier Interactive Patient Education 2016 [employer]   Contusin (Contusion) Una contusin es un hematoma profundo. Las contusiones son el resultado de un traumatismo cerrado en los tejidos y las fibras musculares que estn debajo de la piel. La lesin causa una hemorragia debajo de la piel. La The [place] Paul Travelerspiel sobre la contusin puede tornarse de color McGrawazul, morado o [place]amarillo. Las lesiones menores causarn contusiones sin Engineer, miningdolor, Biomedical engineerpero las ms graves pueden presentar dolor e inflamacin durante un par de semanas.  CAUSAS  Generalmente, esta afeccin se debe a un golpe, un traumatismo o una fuerza directa en una zona del cuerpo. SNTOMAS  Los sntomas de esta afeccin incluyen lo siguiente:  Hinchazn de la zona lesionada.  Dolor y sensibilidad en la zona de la lesin.  Cambio de color. La zona puede enrojecerse y [employer], [place] o [place]. DIAGNSTICO  Esta afeccin se diagnostica en funcin de un examen fsico y de la historia clnica. Puede ser necesario hacer una radiografa, una tomografa computarizada (TC) o  una resonancia magntica (RM) para determinar si hubo lesiones asociadas, como huesos rotos (fracturas). TRATAMIENTO  El tratamiento especfico de esta afeccin depender de la zona del cuerpo donde se produjo la lesin. En general, el mejor tratamiento para una contusin es el reposo, la aplicacin de hielo, la compresin y la elevacin de la zona de la lesin. Generalmente, esto se conoce como la estrategia de RHCE. Para Human resources officer, tambin pueden recomendarle antiinflamatorios de [place].  INSTRUCCIONES PARA EL CUIDADO EN EL HOGAR   Mantenga la zona de la lesin en reposo.  Si se lo indican, aplique hielo sobre la zona lesionada:  Ponga el hielo en una bolsa plstica.  Coloque una toalla entre la piel y la bolsa de hielo.  Coloque el hielo durante , 2 a 3veces por Futures trader.  Si se lo indican, ejerza una compresin suave en la zona de la lesin con una venda elstica. Asegrese de que la venda no est [place]. [place] y vuelva a colocarse la venda como se lo haya indicado el mdico.  Cuando est sentado o acostado, eleve la zona de la lesin por encima del nivel del corazn, si es posible.  Tome los medicamentos de venta libre y los recetados solamente como se lo haya indicado el mdico. SOLICITE ATENCIN MDICA SI:  Los sntomas no mejoran despus de [address] de [place].  Los sntomas empeoran.  Tiene dificultad para mover la zona lesionada. SOLICITE ATENCIN MDICA DE INMEDIATO SI:   Siente dolor intenso.  Siente adormecimiento en una mano o un pie.  La mano o el pie estn plidos o fros.   Esta informacin no tiene Theme park manager el consejo del mdico. Asegrese de hacerle al mdico cualquier pregunta que tenga.   Document Released: 06/14/2005 Document Revised: 05/26/2015 Elsevier Interactive Patient Education [employer]

## 2016-07-25 ENCOUNTER — Encounter: Payer: Self-pay | Admitting: Physician Assistant

## 2016-07-25 DIAGNOSIS — H7202 Central perforation of tympanic membrane, left ear: Secondary | ICD-10-CM | POA: Insufficient documentation

## 2016-07-25 DIAGNOSIS — H9192 Unspecified hearing loss, left ear: Secondary | ICD-10-CM | POA: Insufficient documentation

## 2017-02-07 LAB — GLUCOSE, POCT (MANUAL RESULT ENTRY): POC GLUCOSE: 95 mg/dL (ref 70–99)

## 2021-07-26 ENCOUNTER — Encounter (HOSPITAL_COMMUNITY): Payer: Self-pay

## 2021-07-26 ENCOUNTER — Emergency Department (HOSPITAL_COMMUNITY)
Admission: EM | Admit: 2021-07-26 | Discharge: 2021-07-26 | Disposition: A | Discharge status: Home / Self Care | Payer: No Typology Code available for payment source | Attending: Emergency Medicine | Admitting: Emergency Medicine

## 2021-07-26 DIAGNOSIS — J101 Influenza due to other identified influenza virus with other respiratory manifestations: Secondary | ICD-10-CM | POA: Insufficient documentation

## 2021-07-26 DIAGNOSIS — Z20822 Contact with and (suspected) exposure to covid-19: Secondary | ICD-10-CM | POA: Insufficient documentation

## 2021-07-26 LAB — RESP PANEL BY RT-PCR (FLU A&B, COVID) ARPGX2
Influenza A by PCR: POSITIVE — AB
Influenza B by PCR: NEGATIVE
SARS Coronavirus 2 by RT PCR: NEGATIVE

## 2021-07-26 NOTE — Discharge Instructions (Addendum)
Puede comprar medicina como robitussin para ayudar con la tos.  Theraflu para ayudar con su fiebre y congestion.  Tome tylenol o motrin para ayudar con su fiebre. Tambien tome bastante liquido como aguda o gatorade.

## 2021-07-26 NOTE — ED Provider Notes (Signed)
[facility]-EMERGENCY DEPT Provider Note   CSN: [id number] Arrival date & time: 07/26/21  0947     History Chief Complaint  Patient presents with   Generalized Body Aches   Headache    Deborah Vaughan is a 50 y.o. female.  50 y.o female with no PMH presents to the ED with a chief complaint of body aches, cough for the past 3 days.  Patient reports feeling overall body aches, bone pain, has had fevers at home along with chills.  She has been taking tea without any improvement in her symptoms.  She did not have a flu vaccination during this flu season.  A T-max of 100 was recorded at home, have a temperature of 99 on arrival.  He does not have any prior medical history.  No chest pain, no shortness of breath no other complaints.   The history is provided by the patient and medical records.  Headache Associated symptoms: fever and myalgias       History reviewed. No pertinent past medical history.  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     Family History  Problem Relation Age of Onset   Cancer Father    Cancer Brother    Diabetes Brother     Social History   Tobacco Use   Smoking status: Never   Smokeless tobacco: Never  Substance Use Topics   Alcohol use: No    Alcohol/week: 0.0 standard drinks   Drug use: No    Home Medications Prior to Admission medications   Medication Sig Start Date End Date Taking? Authorizing Provider  naproxen sodium (ANAPROX DS) 550 MG tablet Take 1 tablet (550 mg total) by mouth 2 (two) times daily with a meal. 09/22/15   Wallis Bamberg, PA-C    Allergies    Patient has no  known allergies.  Review of Systems   Review of Systems  Constitutional:  Positive for fever.  Musculoskeletal:  Positive for myalgias.  Neurological:  Positive for headaches.   Physical Exam Updated Vital Signs BP (!) 104/56 (BP Location: Left Arm)   Pulse 80   Temp 99 F (37.2 C) (Oral)   Resp 19   Wt 101.7 kg   SpO2 100%   BMI 39.73 kg/m   Physical Exam Vitals and nursing note reviewed.  Constitutional:      Appearance: She is well-developed.  HENT:     Head: Normocephalic and atraumatic.     Mouth/Throat:     Comments: Oropharynx appears dry, without any PTA or tonsillar exudates noted. Cardiovascular:     Rate and Rhythm: Normal rate.  Pulmonary:     Effort: Pulmonary effort is normal.     Comments: Lungs are clear to auscultation without any wheezing, rhonchi, rales. Musculoskeletal:        General: Normal range of motion.     Cervical back: Normal range of motion and neck supple.  Skin:    General: Skin is warm and dry.  Neurological:     Mental Status: She is alert and oriented to person, place, and time.     GCS: GCS eye  subscore is 4. GCS verbal subscore is 5. GCS motor subscore is 6.    ED Results / Procedures / Treatments   Labs (all labs ordered are listed, but only abnormal results are displayed) Labs Reviewed  RESP PANEL BY RT-PCR (FLU A&B, COVID) ARPGX2 - Abnormal; Notable for the following components:      Result Value   Influenza A by PCR POSITIVE (*)    All other components within normal limits    EKG None  Radiology No results found.  Procedures Procedures   Medications Ordered in ED Medications - No data to display  ED Course  I have reviewed the triage vital signs and the nursing notes.  Pertinent labs & imaging results that were available during my care of the patient were reviewed by me and considered in my medical decision making (see chart for details).  Clinical Course as of 07/26/21 1404  Tue Jul 26, 2021  1352  Influenza A By PCR(!): POSITIVE [JS]    Clinical Course User Index [JS] Claude Manges, PA-C   MDM Rules/Calculators/A&P    Patient presents to the ED with 3 days of body aches, nasal congestion, cough.  Has been taking tea and not drinking much fluids without any improvement in her symptoms.  On arrival temperature is 99.  On exam lungs are clear to auscultation without any rales or rhonchi.  Temperature is 99, pressure is soft, however she has not been drinking any water at home and has only been drinking tea.  Oxygen saturations 100% on room air without any tachypnea present.  The rest of her exam is benign.  We did discuss treatment for her positive influenza A test.  She did not receive an influenza vaccine this season.  Discussed medications that she can take over-the-counter for symptomatic relief, she is agreeable to treatment and plan.  Patient stable for discharge.     Portions of this note were generated with Scientist, clinical (histocompatibility and immunogenetics). Dictation errors may occur despite best attempts at proofreading.  Final Clinical Impression(s) / ED Diagnoses Final diagnoses:  Influenza A    Rx / DC Orders ED Discharge Orders     None        Claude Manges, PA-C 07/26/21 1404    Arby Barrette, MD 07/26/21 1520

## 2021-07-26 NOTE — ED Triage Notes (Signed)
Pt arrived via POV, c/o body aches and headaches x3 days. Denies any known sick contacts.   All info obtained using interpretation services.

## 2022-10-19 DIAGNOSIS — E119 Type 2 diabetes mellitus without complications: Secondary | ICD-10-CM

## 2022-10-19 HISTORY — DX: Type 2 diabetes mellitus without complications: E11.9

## 2022-11-13 ENCOUNTER — Encounter (HOSPITAL_COMMUNITY): Payer: Self-pay

## 2022-11-13 ENCOUNTER — Ambulatory Visit (HOSPITAL_COMMUNITY)
Admission: RE | Admit: 2022-11-13 | Discharge: 2022-11-13 | Disposition: A | Discharge status: Home / Self Care | Payer: No Typology Code available for payment source | Source: Ambulatory Visit | Attending: Internal Medicine | Admitting: Internal Medicine

## 2022-11-13 VITALS — BP 111/75 | HR 79 | Temp 97.7°F | Resp 17

## 2022-11-13 DIAGNOSIS — R0789 Other chest pain: Secondary | ICD-10-CM

## 2022-11-13 DIAGNOSIS — E1165 Type 2 diabetes mellitus with hyperglycemia: Secondary | ICD-10-CM | POA: Insufficient documentation

## 2022-11-13 DIAGNOSIS — R81 Glycosuria: Secondary | ICD-10-CM | POA: Insufficient documentation

## 2022-11-13 DIAGNOSIS — R1030 Lower abdominal pain, unspecified: Secondary | ICD-10-CM | POA: Insufficient documentation

## 2022-11-13 LAB — CBC
HCT: 38 % (ref 36.0–46.0)
Hemoglobin: 12.6 g/dL (ref 12.0–15.0)
MCH: 24.9 pg — ABNORMAL LOW (ref 26.0–34.0)
MCHC: 33.2 g/dL (ref 30.0–36.0)
MCV: 75.1 fL — ABNORMAL LOW (ref 80.0–100.0)
Platelets: 185 10*3/uL (ref 150–400)
RBC: 5.06 MIL/uL (ref 3.87–5.11)
RDW: 22.1 % — ABNORMAL HIGH (ref 11.5–15.5)
WBC: 7.9 10*3/uL (ref 4.0–10.5)
nRBC: 0 % (ref 0.0–0.2)

## 2022-11-13 LAB — POCT URINALYSIS DIPSTICK, ED / UC
Bilirubin Urine: NEGATIVE
Glucose, UA: >=1000 mg/dL — AB
Ketones, ur: NEGATIVE mg/dL
Leukocytes,Ua: NEGATIVE
Nitrite: NEGATIVE
Protein, ur: NEGATIVE mg/dL
Specific Gravity, Urine: <=1.005 (ref 1.005–1.030)
Urobilinogen, UA: 0.2 mg/dL (ref 0.0–1.0)
pH: 5.5 (ref 5.0–8.0)

## 2022-11-13 LAB — BASIC METABOLIC PANEL
Anion gap: 10 (ref 5–15)
BUN: 10 mg/dL (ref 6–20)
CO2: 26 mmol/L (ref 22–32)
Calcium: 9.3 mg/dL (ref 8.9–10.3)
Chloride: 100 mmol/L (ref 98–111)
Creatinine, Ser: 0.65 mg/dL (ref 0.44–1.00)
GFR, Estimated: >60 mL/min (ref >60)
Glucose, Bld: 423 mg/dL — ABNORMAL HIGH (ref 70–99)
Potassium: 4.8 mmol/L (ref 3.5–5.1)
Sodium: 136 mmol/L (ref 135–145)

## 2022-11-13 LAB — POC URINE PREG, ED: Preg Test, Ur: NEGATIVE

## 2022-11-13 LAB — CBG MONITORING, ED: Glucose-Capillary: 420 mg/dL — ABNORMAL HIGH (ref 70–99)

## 2022-11-13 MED ORDER — METFORMIN HCL 500 MG PO TABS: 500.0000 mg | ORAL_TABLET | Freq: Two times a day (BID) | ORAL | 1 refills | Status: DC

## 2022-11-13 NOTE — Discharge Instructions (Addendum)
Lo atendieron hoy y descubrieron que tena niveles elevados de azcar en la sangre. Esto sugiere que usted puede tener diabetes, un problema con el cuerpo que regula su nivel de azcar en la sangre. Con el tiempo, los niveles altos de azcar en sangre pueden daar sus rganos y aumentar su riesgo de sufrir ataques cardacos y accidentes cerebrovasculares.  - Tome sus nuevos medicamentos segn lo recetado. - Evite las bebidas azucaradas (p. ej., jugos, refrescos) o alimentos (p. ej., brownies, galletas) que pueden elevar el nivel de azcar en la sangre rpidamente. - Consuma comidas bien equilibradas con regularidad para evitar cadas y picos de Museum/gallery exhibitions officer. - Puede encontrar ms recursos para su dieta y estilo de vida aqu: [email or web address] - Mantngase bien hidratado con agua (al menos 8 tazas al Training and development officer) - Concierte una cita con su mdico de atencin primaria o un endocrinlogo dentro de dos o tres das para Physiological scientist su rgimen de medicacin para la diabetes.  Si desarrolla algn sntoma nuevo o que empeora o no mejora en los prximos 2 a 3 das, regrese. Si sus sntomas son graves, acuda a la sala de Multimedia programmer. Haga un seguimiento con su proveedor de atencin primaria para una evaluacin y control adicionales de sus sntomas, as como visitas de Therapist, sports continuas. Espero que se sienta mejor!  You were seen today and found to have elevated blood sugar. This suggests you may have diabetes - a problem with your body regulating its sugar level in the blood. Over time, high blood sugar levels can damage your organs and raise your risk of heart attacks and stroke.  - Take your new medications as prescribed.  - Avoid sugary drinks (eg, juice, soda) or foods (eg, brownies, cookies) that can raise your blood sugar quickly. - Eat well-balanced meals regularly to avoid dips and spikes in blood sugar. - You can find more resources for your diet and lifestyle here: [email or web address]  - Stay well-hydrated  with water (at least 8 cups per day) - Make an appointment with your primary care doctor or an endocrinologist within two to three days to discuss your diabetic medication regimen.   If you develop any new or worsening symptoms or do not improve in the next 2 to 3 days, please return.  If your symptoms are severe, please go to the emergency room.  Follow-up with your primary care provider for further evaluation and management of your symptoms as well as ongoing wellness visits.  I hope you feel better!

## 2022-11-13 NOTE — ED Triage Notes (Signed)
Used interpretor  Pt c/o lower abd pains for a month. Reports burning with urination and has foul odor for 2 weeks. Denies blood in urine.  Denies taking any medications for pains

## 2022-11-13 NOTE — ED Notes (Signed)
Pt d/c'd by CMA

## 2022-11-13 NOTE — ED Provider Notes (Signed)
[place]    CSN: [id number] Arrival date & time: 11/13/22  1248      History   Chief Complaint Chief Complaint  Patient presents with   appt 1   Abdominal Pain    HPI Deborah Vaughan is a 52 y.o. female.   Patient presents to urgent care for evaluation of left lower abdominal/suprapubic abdominal discomfort that started approximately 2 months ago with associated dysuria and urinary frequency.  She states she is also been experiencing increased thirst, dry lips, and dry mouth for the last couple of months no matter how much she is able to drink.  Denies history of diabetes or kidney problems.  She has not been treated for urinary tract infection recently and denies recurrent UTIs.  Denies vaginal symptoms, recent nausea, vomiting, dizziness, diarrhea, blood/mucus of the stool, back pain, flank pain, headache, vision changes, or changes in diet.  No upper abdominal discomfort reported.  She does report central chest pain that is slightly to the left of the sternum that is infrequent and intermittent without known trigger.  Chest discomfort is currently a 3 on a scale of 0-10 and worse when she puts her hand to her chest to press on the area of pain.  No recent heavy lifting, trauma/injuries to the chest wall or the abdomen, or heart palpitations.  She is not short of breath and denies recent viral URI symptoms with cough or fever/chills.  No personal or family history of diabetes or other chronic medical problems.  She is not a smoker and denies drug use.  She does not drink alcohol.  No personal or family history of cardiovascular problems either.  No orthopnea or leg swelling reported.  She has had a cesarean section to the abdomen, however no other history of abdominal surgeries.  No recent antibiotic or steroid use reported.  She has not attempted use of any over-the-counter medications to help with symptoms before coming to urgent care.   Abdominal Pain   History  reviewed. No pertinent past medical history.  Patient Active Problem List   Diagnosis Date Noted   Type 2 diabetes mellitus with hyperglycemia, without long-term current use of insulin ([place]) 11/13/2022   Hearing loss of left ear 07/25/2016   Central perforation of tympanic membrane of left ear 07/25/2016   Acute pharyngitis 11/21/2013   Periodic health assessment, general screening, adult 11/21/2013    Past Surgical History:  Procedure Laterality Date   CESAREAN SECTION      OB History   No obstetric history on file.      Home Medications    Prior to Admission medications   Medication Sig Start Date End Date Taking? Authorizing Provider  metFORMIN (GLUCOPHAGE) 500 MG tablet Take 1 tablet (500 mg total) by mouth 2 (two) times daily with a meal. 11/13/22  Yes Haydan Mansouri, Stasia Cavalier, FNP  naproxen sodium (ANAPROX DS) 550 MG tablet Take 1 tablet (550 mg total) by mouth 2 (two) times daily with a meal. 09/22/15   Jaynee Eagles, PA-C    Family History Family History  Problem Relation Age of Onset   Cancer Father    Cancer Brother    Diabetes Brother     Social History Social History   Tobacco Use   Smoking status: Never   Smokeless tobacco: Never  Substance Use Topics   Alcohol use: No    Alcohol/week: 0.0 standard drinks of alcohol   Drug use: No     Allergies   Patient has  no known allergies.   Review of Systems Review of Systems  Gastrointestinal:  Positive for abdominal pain.  Per HPI   Physical Exam Triage Vital Signs ED Triage Vitals  Enc Vitals Group     BP 11/13/22 1315 111/75     Pulse Rate 11/13/22 1315 79     Resp 11/13/22 1315 17     Temp 11/13/22 1315 97.7 F (36.5 C)     Temp Source 11/13/22 1315 Oral     SpO2 11/13/22 1315 96 %     Weight --      Height --      Head Circumference --      Peak Flow --      Pain Score 11/13/22 1310 9     Pain Loc --      Pain Edu? --      Excl. in [place]? --    No data found.  Updated Vital Signs BP  111/75 (BP Location: Right Arm)   Pulse 79   Temp 97.7 F (36.5 C) (Oral)   Resp 17   SpO2 96%   Visual Acuity Right Eye Distance:   Left Eye Distance:   Bilateral Distance:    Right Eye Near:   Left Eye Near:    Bilateral Near:     Physical Exam Vitals and nursing note reviewed.  Constitutional:      Appearance: She is obese. She is not ill-appearing or toxic-appearing.  HENT:     Head: Normocephalic and atraumatic.     Right Ear: Hearing and external ear normal.     Left Ear: Hearing and external ear normal.     Nose: Nose normal.     Mouth/Throat:     Lips: Pink.     Mouth: Mucous membranes are moist. No injury.     Tongue: No lesions. Tongue does not deviate from midline.     Palate: No mass and lesions.     Pharynx: Oropharynx is clear. Uvula midline. No pharyngeal swelling, oropharyngeal exudate, posterior oropharyngeal erythema or uvula swelling.     Tonsils: No tonsillar exudate or tonsillar abscesses.  Eyes:     General: Lids are normal. Vision grossly intact. Gaze aligned appropriately.     Extraocular Movements: Extraocular movements intact.     Conjunctiva/sclera: Conjunctivae normal.  Cardiovascular:     Rate and Rhythm: Normal rate and regular rhythm.     Pulses:          Radial pulses are 2+ on the right side and 2+ on the left side.     Heart sounds: Normal heart sounds, S1 normal and S2 normal.     Comments: Chest discomfort reproducible to palpation of the central chest wall.  Pulmonary:     Effort: Pulmonary effort is normal. No respiratory distress.     Breath sounds: Normal breath sounds and air entry. No wheezing or rales.  Abdominal:     General: Abdomen is flat and protuberant. Bowel sounds are normal.     Palpations: Abdomen is soft.     Tenderness: There is abdominal tenderness in the suprapubic area and left lower quadrant. There is no right CVA tenderness, left CVA tenderness or guarding.  Musculoskeletal:     Cervical back: Neck supple.      Right lower leg: No edema.     Left lower leg: No edema.  Skin:    General: Skin is warm and dry.     Capillary Refill: Capillary refill takes less than  2 seconds.     Findings: No rash.  Neurological:     General: No focal deficit present.     Mental Status: She is alert and oriented to person, place, and time. Mental status is at baseline.     Cranial Nerves: Cranial nerves 2-12 are intact. No dysarthria or facial asymmetry.     Sensory: Sensation is intact.     Motor: Motor function is intact.     Coordination: Coordination is intact.     Gait: Gait is intact.  Psychiatric:        Mood and Affect: Mood normal.        Speech: Speech normal.        Behavior: Behavior normal.        Thought Content: Thought content normal.        Judgment: Judgment normal.      UC Treatments / Results  Labs (all labs ordered are listed, but only abnormal results are displayed) Labs Reviewed  POCT URINALYSIS DIPSTICK, ED / UC - Abnormal; Notable for the following components:      Result Value   Glucose, UA >=1000 (*)    Hgb urine dipstick MODERATE (*)    All other components within normal limits  CBG MONITORING, ED - Abnormal; Notable for the following components:   Glucose-Capillary 420 (*)    All other components within normal limits  CBC  BASIC METABOLIC PANEL  HEMOGLOBIN A1C  POC URINE PREG, ED  CERVICOVAGINAL ANCILLARY ONLY    EKG   Radiology No results found.  Procedures Procedures (including critical care time)  Medications Ordered in UC Medications - No data to display  Initial Impression / Assessment and Plan / UC Course  I have reviewed the triage vital signs and the nursing notes.  Pertinent labs & imaging results that were available during my care of the patient were reviewed by me and considered in my medical decision making (see chart for details).   1.  Type 2 diabetes mellitus with hyperglycemia (new onset), glycosuria, lower abdominal pain Urinalysis  is remarkable for glycosuria with moderate hemoglobin.  CBG in clinic is 23 indicating patient is insulin resistant and likely with new onset type 2 diabetes.  She is obese, however she is not a smoker and denies drug use.  Basic blood work checked today to assess for kidney function and electrolyte imbalance.  She is nontoxic in appearance with hemodynamically stable vital signs.  She does not appear to be clinically dehydrated and is mentating at her baseline.  No evidence of urinary tract infection to urinalysis.  We will perform a vaginal swab to ensure she does not have a vaginal yeast infection contributing to her left lower quadrant abdominal discomfort.  Abdominal exam is without peritoneal signs.  Low suspicion for diabetic ketoacidosis or other complications directly related to type 2 diabetes myelitis contributing to patient's lower quadrant abdominal discomfort.  She may use Tylenol 1000 mg every 6 hours as needed for acute pain to the abdomen.  Urine pregnancy testing is negative.  Referral to endocrinology and PCP assistance initiated today for ongoing management of type 2 diabetes. Discussed symptoms of hypo-/hyperglycemia, patient voices understanding.  Discussed reduction in carbohydrate intake and balanced diet.   EKG is without ST/T wave changes, findings are stable.  No EKGs on file to compare.  May use Tylenol and heat to the chest 20 minutes on 20 minutes off to help with chest discomfort as this is reproducible to the chest  and likely musculoskeletal in nature.  Suspect her blood sugar has been elevated for many months now and this is an incidental finding.  We will start 500 mg twice daily metformin to reduce blood sugar.  Advise close follow-up with endocrinology/PCP.  Discussed risks of sustained hyperglycemia and strict ER return precautions.  Patient voices understanding.  Spanish video interpreter used for entirety of patient visit.  Discussed physical exam and available lab  work findings in clinic with patient.  Counseled patient regarding appropriate use of medications and potential side effects for all medications recommended or prescribed today. Discussed red flag signs and symptoms of worsening condition,when to call the PCP office, return to urgent care, and when to seek higher level of care in the emergency department. Patient verbalizes understanding and agreement with plan. All questions answered. Patient discharged in stable condition.    Final Clinical Impressions(s) / UC Diagnoses   Final diagnoses:  Type 2 diabetes mellitus with hyperglycemia, without long-term current use of insulin (HCC)  Glycosuria  Lower abdominal pain     Discharge Instructions      Lo atendieron hoy y descubrieron que tena niveles elevados de azcar en la sangre. Esto sugiere que usted puede tener diabetes, un problema con el cuerpo que regula su nivel de azcar en la sangre. Con el tiempo, los niveles altos de azcar en sangre pueden daar sus rganos y aumentar su riesgo de sufrir ataques cardacos y accidentes cerebrovasculares.  - Tome sus nuevos medicamentos segn lo recetado. - Evite las bebidas azucaradas (p. ej., jugos, refrescos) o alimentos (p. ej., brownies, galletas) que pueden elevar el nivel de azcar en la sangre rpidamente. - Consuma comidas bien equilibradas con regularidad para evitar cadas y picos de Museum/gallery exhibitions officer. - Puede encontrar ms recursos para su dieta y estilo de vida aqu: [email or web address] - Mantngase bien hidratado con agua (al menos 8 tazas al Training and development officer) - Concierte una cita con su mdico de atencin primaria o un endocrinlogo dentro de dos o tres das para Physiological scientist su rgimen de medicacin para la diabetes.  Si desarrolla algn sntoma nuevo o que empeora o no mejora en los prximos 2 a 3 das, regrese. Si sus sntomas son graves, acuda a la sala de Multimedia programmer. Haga un seguimiento con su proveedor de atencin primaria para una evaluacin y  control adicionales de sus sntomas, as como visitas de Therapist, sports continuas. Espero que se sienta mejor!  You were seen today and found to have elevated blood sugar. This suggests you may have diabetes - a problem with your body regulating its sugar level in the blood. Over time, high blood sugar levels can damage your organs and raise your risk of heart attacks and stroke.  - Take your new medications as prescribed.  - Avoid sugary drinks (eg, juice, soda) or foods (eg, brownies, cookies) that can raise your blood sugar quickly. - Eat well-balanced meals regularly to avoid dips and spikes in blood sugar. - You can find more resources for your diet and lifestyle here: [email or web address]  - Stay well-hydrated with water (at least 8 cups per day) - Make an appointment with your primary care doctor or an endocrinologist within two to three days to discuss your diabetic medication regimen.   If you develop any new or worsening symptoms or do not improve in the next 2 to 3 days, please return.  If your symptoms are severe, please go to the emergency room.  Follow-up with your primary care provider for further evaluation and  management of your symptoms as well as ongoing wellness visits.  I hope you feel better!    ED Prescriptions     Medication Sig Dispense Auth. Provider   metFORMIN (GLUCOPHAGE) 500 MG tablet Take 1 tablet (500 mg total) by mouth 2 (two) times daily with a meal. 60 tablet Leanndra Pember, Stasia Cavalier, FNP      PDMP not reviewed this encounter.   Talbot Grumbling, [place] 11/13/22 1422

## 2022-11-14 LAB — HEMOGLOBIN A1C
Hgb A1c MFr Bld: 11.2 % — ABNORMAL HIGH (ref 4.8–5.6)
Mean Plasma Glucose: 275 mg/dL

## 2022-12-15 ENCOUNTER — Ambulatory Visit: Payer: Self-pay | Admitting: Nurse Practitioner

## 2023-12-06 ENCOUNTER — Ambulatory Visit: Payer: Self-pay | Admitting: Internal Medicine

## 2023-12-06 ENCOUNTER — Encounter: Payer: Self-pay | Admitting: Internal Medicine

## 2023-12-06 VITALS — BP 120/76 | HR 82 | Resp 16 | Ht 62.0 in | Wt 215.0 lb

## 2023-12-06 DIAGNOSIS — Z599 Problem related to housing and economic circumstances, unspecified: Secondary | ICD-10-CM

## 2023-12-06 DIAGNOSIS — E119 Type 2 diabetes mellitus without complications: Secondary | ICD-10-CM

## 2023-12-06 DIAGNOSIS — N898 Other specified noninflammatory disorders of vagina: Secondary | ICD-10-CM | POA: Insufficient documentation

## 2023-12-06 DIAGNOSIS — R1032 Left lower quadrant pain: Secondary | ICD-10-CM

## 2023-12-06 DIAGNOSIS — E669 Obesity, unspecified: Secondary | ICD-10-CM | POA: Insufficient documentation

## 2023-12-06 DIAGNOSIS — E1165 Type 2 diabetes mellitus with hyperglycemia: Secondary | ICD-10-CM

## 2023-12-06 DIAGNOSIS — G8929 Other chronic pain: Secondary | ICD-10-CM | POA: Insufficient documentation

## 2023-12-06 LAB — POCT WET PREP WITH KOH
Clue Cells Wet Prep HPF POC: NEGATIVE
KOH Prep POC: NEGATIVE
Trichomonas, UA: NEGATIVE
Yeast Wet Prep HPF POC: NEGATIVE

## 2023-12-06 LAB — POCT URINALYSIS DIPSTICK
Bilirubin, UA: NEGATIVE
Blood, UA: NEGATIVE
Glucose, UA: POSITIVE — AB
Ketones, UA: 15
Leukocytes, UA: NEGATIVE
Nitrite, UA: NEGATIVE
Protein, UA: NEGATIVE
Spec Grav, UA: 1.015 (ref 1.010–1.025)
Urobilinogen, UA: 1 U/dL
pH, UA: 5.5 (ref 5.0–8.0)

## 2023-12-06 LAB — GLUCOSE, POCT (MANUAL RESULT ENTRY): POC Glucose: 254 mg/dL — AB (ref 70–99)

## 2023-12-06 MED ORDER — BLOOD GLUCOSE TEST VI STRP: ORAL_STRIP | 11 refills | Status: DC

## 2023-12-06 MED ORDER — EMPAGLIFLOZIN 10 MG PO TABS: ORAL_TABLET | ORAL | 11 refills | Status: DC

## 2023-12-06 MED ORDER — BLOOD GLUCOSE MONITORING SUPPL DEVI: 0 refills | Status: DC

## 2023-12-06 MED ORDER — LANCETS MISC. MISC: 11 refills | Status: DC

## 2023-12-06 MED ORDER — METFORMIN HCL ER 500 MG PO TB24: ORAL_TABLET | ORAL | 11 refills | Status: AC

## 2023-12-06 MED ORDER — GLIPIZIDE 5 MG PO TABS: ORAL_TABLET | ORAL | 11 refills | Status: AC

## 2023-12-06 MED ORDER — LANCET DEVICE MISC: 0 refills | Status: DC

## 2023-12-06 MED ORDER — OZEMPIC (0.25 OR 0.5 MG/DOSE) 2 MG/3ML ~~LOC~~ SOPN: PEN_INJECTOR | SUBCUTANEOUS | 11 refills | Status: DC

## 2023-12-06 NOTE — Progress Notes (Unsigned)
    12/06/2023    3:33 PM 12/06/2023    3:31 PM  GAD 7 : Generalized Anxiety Score  Nervous, Anxious, on Edge 3 3  Control/stop worrying 3 3  Worry too much - different things 3 3  Trouble relaxing 1 1  Restless 1 1  Easily annoyed or irritable 1 1  Afraid - awful might happen 1 1  Total GAD 7 Score 13 13  Anxiety Difficulty Somewhat difficult Somewhat difficult        12/06/2023    3:32 PM 09/22/2015   10:16 AM 11/21/2013    9:18 AM  Depression screen PHQ 2/9  Decreased Interest 2 0 0  Down, Depressed, Hopeless 1 0 0  PHQ - 2 Score 3 0 0  Altered sleeping 1    Tired, decreased energy 1    Change in appetite 2    Feeling bad or failure about yourself  1    Trouble concentrating 0    Moving slowly or fidgety/restless 0    Suicidal thoughts 0    PHQ-9 Score 8    Difficult doing work/chores Not difficult at all     SDOH Screenings   Food Insecurity: Food Insecurity Present (12/06/2023)  Housing: High Risk (12/06/2023)  Transportation Needs: Unmet Transportation Needs (12/06/2023)  Utilities: At Risk (12/06/2023)  Depression (PHQ2-9): Medium Risk (12/06/2023)  Tobacco Use: Low Risk  (12/06/2023)   Client was agreeable to supportive counseling. Community health team will reach out in regards to SDOH needs. SW team will reach out for counseling. Dr. Delrae Alfred was informed.

## 2023-12-06 NOTE — Progress Notes (Unsigned)
 Subjective:    Patient ID: Deborah Vaughan, female   DOB: [birth year], 53 y.o.   MRN: [id number]   HPI  Here to establish  Deborah Vaughan interprets   LLQ pain, which radiates down onto anterior left thigh.  Has had this for about 5 months and is constant.  At times worse with "inflammation" where the area becomes firmer and then her anterior thigh goes numb.   She notes that walking for a long distance seems to be a trigger for increased pain.   Eating has no effect on the pain No change in pain with urination or defecation. She does describe urge to urinate increasing with a bad urine odor that started when this LLQ pain started as well.   + urinary frequency with no increase in nocturia. No dysuria She does have pain in her left flank at times. No fever. She is having yellow vaginal discharge that does have an odor.  She does have vaginal itching as well.  This also started with all of above symptoms. She is sexually active only with her husband She believes he is monogamous with her.   Initially, she states she has not been seen for this, but was seen February of 2024 with similar symptoms in ED and diagnosed with new onset DM with blood glucose of 423 and A1C of 11.2%  She has not been able to access healthcare elsewhere.   Patient has gained 52 lbs since 2017.  States she gained the weight after stopped working at [employer]   Current Meds  Medication Sig   metFORMIN (GLUCOPHAGE) 500 MG tablet Take 1 tablet (500 mg total) by mouth 2 (two) times daily with a meal.   naproxen sodium (ANAPROX DS) 550 MG tablet Take 1 tablet (550 mg total) by mouth 2 (two) times daily with a meal.   No Known Allergies  Past Medical History:  Diagnosis Date   DM type 2 (diabetes mellitus, type 2) (HCC) 10/2022   Obesity (BMI 35.0-39.9 without comorbidity)    Past Surgical History:  Procedure Laterality Date   CESAREAN SECTION  1994   TUBAL LIGATION  1994   Family History  Problem  Relation Age of Onset   Cancer Father        ?gastric   Cancer Brother        Testicular   Diabetes Brother    Social History   Socioeconomic History   Marital status: Married    Spouse name: Antonio   Number of children: 3   Years of education: Not on file   Highest education level: 10th grade  Occupational History   Occupation: Psychologist, 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   Social Drivers of Corporate investment banker Strain: Not on file  Food Insecurity: Food Insecurity Present (12/06/2023)   Hunger Vital Sign    Worried About Running Out of Food in the Last Year: Sometimes true    Ran Out of Food in the Last Year: Never true  Transportation Needs: Unmet  Transportation Needs (12/06/2023)   PRAPARE - Administrator, Civil Service (Medical): Yes    Lack of Transportation (Non-Medical): Yes  Physical Activity: Not on file  Stress: Not on file  Social Connections: Not on file  Intimate Partner Violence: Not At Risk (12/06/2023)   Humiliation, Afraid, Rape, and Kick questionnaire    Fear of Current or Ex-Partner: No    Emotionally Abused: No    Physically Abused: No    Sexually Abused: No      Review of Systems    Objective:   BP 120/76 (BP Location: Left Arm, Patient Position: Sitting, Cuff Size: Normal)   Pulse 82   Resp 16   Ht 5\' 2"  (1.575 m)   Wt 215 lb (97.5 kg)   SpO2 96%   BMI 39.32 kg/m   Physical Exam Constitutional:      Appearance: She is obese.  HENT:     Head: Normocephalic and atraumatic.     Right Ear: Tympanic membrane, ear canal and external ear normal.      Left Ear: Tympanic membrane is perforated and retracted.     Mouth/Throat:     Mouth: Mucous membranes are moist.  Eyes:     Extraocular Movements: Extraocular movements intact.     Pupils: Pupils are equal, round, and reactive to light.  Neck:     Thyroid: No thyroid mass or thyromegaly.  Cardiovascular:     Rate and Rhythm: Normal rate and regular rhythm.     Heart sounds: S1 normal and S2 normal.     No S3 or S4 sounds.     Comments: No carotid bruits.  Carotid, radial, femoral, DP and PT pulses normal and equal.   Abdominal:     General: Bowel sounds are normal.     Palpations: There is no hepatomegaly, splenomegaly or mass.     Tenderness: There is abdominal tenderness (Left flank, LLQ and suprapubic area.).  Genitourinary:    Comments: Mild external inflammation of vulvar area. Mild inflammation of vaginal mucosa Watery discharge without odor Unable to visualize or reach cervix on bimanual exam due to inability to relax pelvic floor.  Vaginal walls collapse over view with speculum insertion. No definite CMT Some uterine and left adnexal tenderness. Exam limited by size.  Musculoskeletal:     Right lower leg: No edema.     Left lower leg: No edema.  Lymphadenopathy:     Head:     Right side of head: No submental or submandibular adenopathy.     Left side of head: No submental or submandibular adenopathy.     Cervical: No cervical adenopathy.     Upper Body:     Right upper body: No supraclavicular adenopathy.     Left upper body: No supraclavicular adenopathy.  Neurological:     Mental Status: She is alert.      Assessment & Plan   Left abdominal/pelvic pain:  Very chronic.  UA, urine culture, CBC and CMP.  CT ordered and waiting for her to get [facility] orange card.  Encouraged her to start immediately.  To discuss with SW as Solmon Ice will be working with her also possibly for counseling as well as case management and [facility].  Suspect uterine or left  adnexal issue, but not clear on exam, so going with CT.  2.  DM and Obesity:  A1C was quite high last year and only on low dose Metformin intermittently since.  Rxs for Ozempic  0.25 mg weekly and Jardiance 10 mg daily to MAP, which will take time.  In meantime, start Glipizide 5 mg  and Metformin XR 1000 mg twice daily and then will see if can wean to just the first two meds.   Orange card for glucometer and test strips.. Will need to address diet and physical activity at follow up. A1C, TSH, Free T4 FLP, CMP.  3.  Vaginal Discharge:  no findings on wet prep or definitively on exam.  GC/Chlamydia sent.     4.  Financial difficulties/Anxiety:  HOTeam/LCSWA  5.  Left TM perforation and retraction:  chronic.  Will address in future.

## 2023-12-07 LAB — COMPREHENSIVE METABOLIC PANEL
ALT: 26 IU/L (ref 0–32)
AST: 23 IU/L (ref 0–40)
Albumin: 4.1 g/dL (ref 3.9–4.9)
Alkaline Phosphatase: 152 IU/L — ABNORMAL HIGH (ref 44–121)
BUN/Creatinine Ratio: 21 (ref 9–23)
BUN: 14 mg/dL (ref 6–24)
Bilirubin Total: 0.3 mg/dL (ref 0.0–1.2)
CO2: 23 mmol/L (ref 20–29)
Calcium: 9.3 mg/dL (ref 8.7–10.2)
Chloride: 103 mmol/L (ref 96–106)
Creatinine, Ser: 0.66 mg/dL (ref 0.57–1.00)
Globulin, Total: 3 g/dL (ref 1.5–4.5)
Glucose: 247 mg/dL — ABNORMAL HIGH (ref 70–99)
Potassium: 4.5 mmol/L (ref 3.5–5.2)
Sodium: 140 mmol/L (ref 134–144)
Total Protein: 7.1 g/dL (ref 6.0–8.5)
eGFR: 107 mL/min/{1.73_m2} (ref >59)

## 2023-12-07 LAB — CBC WITH DIFFERENTIAL/PLATELET
Basophils Absolute: 0.1 10*3/uL (ref 0.0–0.2)
Basos: 1 %
EOS (ABSOLUTE): 0.1 10*3/uL (ref 0.0–0.4)
Eos: 1 %
Hematocrit: 37.7 % (ref 34.0–46.6)
Hemoglobin: 11.9 g/dL (ref 11.1–15.9)
Immature Grans (Abs): 0 10*3/uL (ref 0.0–0.1)
Immature Granulocytes: 0 %
Lymphocytes Absolute: 3.5 10*3/uL — ABNORMAL HIGH (ref 0.7–3.1)
Lymphs: 40 %
MCH: 23.7 pg — ABNORMAL LOW (ref 26.6–33.0)
MCHC: 31.6 g/dL (ref 31.5–35.7)
MCV: 75 fL — ABNORMAL LOW (ref 79–97)
Monocytes Absolute: 0.7 10*3/uL (ref 0.1–0.9)
Monocytes: 8 %
Neutrophils Absolute: 4.6 10*3/uL (ref 1.4–7.0)
Neutrophils: 50 %
Platelets: 213 10*3/uL (ref 150–450)
RBC: 5.03 x10E6/uL (ref 3.77–5.28)
RDW: 21.3 % — ABNORMAL HIGH (ref 11.7–15.4)
WBC: 8.9 10*3/uL (ref 3.4–10.8)

## 2023-12-07 LAB — HGB A1C W/O EAG: Hgb A1c MFr Bld: 9.8 % — ABNORMAL HIGH (ref 4.8–5.6)

## 2023-12-07 LAB — LIPID PANEL W/O CHOL/HDL RATIO
Cholesterol, Total: 170 mg/dL (ref 100–199)
HDL: 41 mg/dL (ref >39)
LDL Chol Calc (NIH): 100 mg/dL — ABNORMAL HIGH (ref 0–99)
Triglycerides: 167 mg/dL — ABNORMAL HIGH (ref 0–149)
VLDL Cholesterol Cal: 29 mg/dL (ref 5–40)

## 2023-12-07 LAB — T4, FREE: Free T4: 0.87 ng/dL (ref 0.82–1.77)

## 2023-12-07 LAB — TSH: TSH: 2.1 u[IU]/mL (ref 0.450–4.500)

## 2023-12-08 LAB — URINE CULTURE

## 2023-12-10 LAB — GC/CHLAMYDIA PROBE AMP
Chlamydia trachomatis, NAA: NEGATIVE
Neisseria Gonorrhoeae by PCR: NEGATIVE

## 2024-04-04 ENCOUNTER — Other Ambulatory Visit: Payer: Self-pay

## 2024-04-04 DIAGNOSIS — E1165 Type 2 diabetes mellitus with hyperglycemia: Secondary | ICD-10-CM

## 2024-04-05 LAB — HEMOGLOBIN A1C
Est. average glucose Bld gHb Est-mCnc: 272 mg/dL
Hgb A1c MFr Bld: 11.1 % — ABNORMAL HIGH (ref 4.8–5.6)

## 2024-04-08 ENCOUNTER — Ambulatory Visit: Payer: Self-pay | Admitting: Internal Medicine

## 2024-04-08 VITALS — BP 110/76 | HR 82 | Resp 18 | Ht 61.0 in | Wt 212.0 lb

## 2024-04-08 DIAGNOSIS — Z23 Encounter for immunization: Secondary | ICD-10-CM

## 2024-04-08 DIAGNOSIS — E669 Obesity, unspecified: Secondary | ICD-10-CM

## 2024-04-08 DIAGNOSIS — E1165 Type 2 diabetes mellitus with hyperglycemia: Secondary | ICD-10-CM

## 2024-04-08 MED ORDER — BLOOD GLUCOSE MONITORING SUPPL DEVI: 0 refills | Status: AC

## 2024-04-08 MED ORDER — EMPAGLIFLOZIN 10 MG PO TABS: ORAL_TABLET | ORAL | 11 refills | Status: AC

## 2024-04-08 MED ORDER — OZEMPIC (0.25 OR 0.5 MG/DOSE) 2 MG/3ML ~~LOC~~ SOPN: PEN_INJECTOR | SUBCUTANEOUS | 11 refills | Status: DC

## 2024-04-08 MED ORDER — LANCETS MISC. MISC: 11 refills | Status: AC

## 2024-04-08 MED ORDER — BLOOD GLUCOSE TEST VI STRP: ORAL_STRIP | 11 refills | Status: AC

## 2024-04-08 MED ORDER — LANCET DEVICE MISC: 0 refills | Status: AC

## 2024-04-08 NOTE — Progress Notes (Signed)
    Subjective:    Patient ID: Deborah Vaughan, female   DOB: 1971/05/18, 53 y.o.   MRN: [id number]   HPI  Deborah Vaughan interprets   DM/Obesity:  did not go to MAP and apply for Ozempic  and Jardiance .  Apparently, thought she needed to apply for orange card.  Discussed would like her to do so, but this was a different application.  A1C now up to 11.1%.  She is not taking Glipzide appropriately.    Current Meds  Medication Sig   glipiZIDE  (GLUCOTROL ) 5 MG tablet 1 tab by mouth twice daily with meals (Patient taking differently: Take 5 mg by mouth daily before breakfast. 1 tab by mouth twice daily with meals)   Lancet Device MISC May substitute to any manufacturer covered by patient's insurance.  Check blood sugar twice daily before meals   metFORMIN  (GLUCOPHAGE -XR) 500 MG 24 hr tablet 2 tabs by mouth twice daily with meals   naproxen  sodium (ANAPROX  DS) 550 MG tablet Take 1 tablet (550 mg total) by mouth 2 (two) times daily with a meal.   No Known Allergies   Review of Systems    Objective:   BP 110/76 (BP Location: Left Arm, Patient Position: Sitting, Cuff Size: Normal)   Pulse 82   Resp 18   Ht 5' 1 (1.549 m)   Wt 212 lb (96.2 kg)   LMP 09/15/2015 (Approximate)   BMI 40.06 kg/m   Physical Exam NAD Lungs:  CTA CV:  RRR without murmur or rub.  Radial and DP pulses normal and equal LE:  No edema.     Assessment & Plan   DM/Obesity:  Resent Rx for Jaridance and Ozempic  and went over how to fill out glucose log.  She is to call when she obtains Ozempic  and will have her follow up 2 weeks later for weight and sugar log.    2.  HM:  Pneumococcal 20 and Tdap today.

## 2024-04-08 NOTE — Patient Instructions (Signed)
 Habla clinica cuando tiene Ozempic

## 2024-05-02 ENCOUNTER — Other Ambulatory Visit: Payer: Self-pay

## 2024-05-16 ENCOUNTER — Encounter: Payer: Self-pay | Admitting: Internal Medicine

## 2024-05-16 ENCOUNTER — Telehealth: Payer: Self-pay | Admitting: Internal Medicine

## 2024-05-23 NOTE — Telephone Encounter (Signed)
 Called patient , no answer and unable to lvm 05/23/24.

## 2024-06-30 NOTE — Telephone Encounter (Signed)
 Spoke with patient, patient states she has been injecting herself Ozempic  for about a month now and is still waiting to receive Jardiance  medication.  Patient states she has been checking her glucose level. Patient has been scheduled for a weight check and sugar log, patient has been notified she is to attend to weight check appointments every two weeks, patient agree.

## 2024-07-04 ENCOUNTER — Other Ambulatory Visit: Payer: Self-pay

## 2024-07-04 VITALS — Wt 212.5 lb

## 2024-07-04 DIAGNOSIS — E1165 Type 2 diabetes mellitus with hyperglycemia: Secondary | ICD-10-CM

## 2024-07-04 NOTE — Progress Notes (Signed)
 Patient reports that she is taking all DM medication. Patient is currently on Ozempic  .25mg  weekly on Mondays. Patient has been on ozempic  for about 6 weeks.

## 2024-07-18 ENCOUNTER — Telehealth: Payer: Self-pay | Admitting: Internal Medicine

## 2024-07-18 ENCOUNTER — Other Ambulatory Visit: Payer: Self-pay

## 2024-07-18 MED ORDER — OZEMPIC (0.25 OR 0.5 MG/DOSE) 2 MG/3ML ~~LOC~~ SOPN: PEN_INJECTOR | SUBCUTANEOUS | 11 refills | Status: DC

## 2024-07-18 NOTE — Progress Notes (Signed)
 Please confirm individual meds and dosing--I see in her chart she is only taking 5 mg of glipizide  in the morning rather than twice daily.   Her sugars are still quite high in 200-300s  Increasing her Ozempic  to 0.5 mg weekly.

## 2024-07-18 NOTE — Progress Notes (Signed)
 Patient was unaware of Ozempic  increase. Patient has been taking .25 mg of ozempic  weekly. Patient takes other DM medications consistently.  Instructed patient to start taking Ozempic  .5mg  weekly. Patient also made aware of taking glipizide  twice daily.

## 2024-08-01 ENCOUNTER — Other Ambulatory Visit: Payer: Self-pay

## 2024-08-04 NOTE — Telephone Encounter (Signed)
 Spoke with patient, patient states she is taking glipizide  twice daily with meals.   She is aware she needs to increase to 0.5mg  , patient injects Monday mornings and states she will inject new dose next Monday. Patient said yes to nutrition with Dr. Fleta and has been scheduled for 08/08/24. Patient also scheduled for sugar logs on 08/22/24.

## 2024-08-08 ENCOUNTER — Ambulatory Visit: Payer: Self-pay | Admitting: Internal Medicine

## 2024-08-11 ENCOUNTER — Other Ambulatory Visit: Payer: Self-pay

## 2024-08-13 ENCOUNTER — Ambulatory Visit: Payer: Self-pay | Admitting: Internal Medicine

## 2024-08-22 ENCOUNTER — Other Ambulatory Visit: Payer: Self-pay

## 2024-08-22 ENCOUNTER — Ambulatory Visit: Payer: Self-pay | Admitting: Internal Medicine

## 2024-08-22 MED ORDER — SEMAGLUTIDE (1 MG/DOSE) 4 MG/3ML ~~LOC~~ SOPN: PEN_INJECTOR | SUBCUTANEOUS | 11 refills | Status: AC

## 2024-08-22 NOTE — Progress Notes (Signed)
 Increase Ozempic  to 1 mg weekly Have her return 2 weeks as usual after starting increased dose. If sugars drop below 100, have her stop glipizide , but call office as well.

## 2024-08-22 NOTE — Progress Notes (Deleted)
 Yesterday: Am: 2 toasts, tea, tomatoe, lettuce Lunch: Salad: spinach, tomatoe, blueberries, belle peppers, cucumbers 10 by 10 cm plate; Salt and lemon Dinner:Chicken broth with rice. 1 leg of chicken.  No fat. 1/2 cup.  Snacks - tea.  She has been eating like this 2-3 time a week.  Other days: AM: Green Juice-green  apple peppers, kiwi, pinapple , spinach Lunch:1 cup of beans Dinner:3  Richelle tacos with chiken,  quesedaos  Quesadias 2 times a week. Cheese. 1 cup a day of milk No yogurt Salman occasionally Avocado 2 -3 per week Meat: some weekends, roast pork 6- 8 oz, little fat.  No donuts, sweet rolls.   Eating like this but no wt loss.  Stress? 8 h sleep a night. No stressors.  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    Do you want to lose wt?  Yes.

## 2024-08-22 NOTE — Progress Notes (Signed)
  Today wight = 216lbs  Last time wight 210lb  Her sugar     AM = 190-326  PM= 190-240  Patient reports that she is taking Ozempic  .5mg  weekly for the past 2 months. Patient is also on Jardiance  daily, Metformin  2 tabs BID and Glipizide  BID.   Will increase to 1mg  ozempic  weekly. Patient has been scheduled to return 2 weeks after starting Ozempic  1 mg weekly.

## 2024-08-22 NOTE — Progress Notes (Unsigned)
   Subjective:    Patient ID: Deborah Vaughan, female    DOB: Sep 16, 1971, 53 y.o.   MRN: [id number] I, MD in training, saw pt for dietary modification.  Not directly supervised by Dr. CHRISTELLA. Interpretor service used.  Did not get interpretor name before he disconnected. No charge.   HPI Dietary assessment : eats lots of fruits. Hardly any dairy, meat, eggs, good fats.  Does not eat too much but is not losing weight.  Eats sweets only when she gets bad news. Otherwise does not like sweets. Good sleep. Low stress life normally.  PMH-Obesity and DM  Meds- Jardiance , Metformin , Glipizide , Semiglutide  SoHx- Takes care of her grand kids. 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 Husband not obese.   Review of Systems:     Objective:   Physical Exam  NAD,  BMI around 40 but inspection of pt reveals a large bony frame.  I think the BMI is falsely elevated and some of the weight might be from bone.  BMI of 30-35 seems more likely.    Assessment & Plan:   Obesity and DM- Pt given a simple meal plan to follow.   Keep it the same initially to avoid confusion. May make changes later.  Meal plan:  1- Low fat yogurt bid 1cup 2- 1/2 avocado bid 3- 1/4 cup walnut a day 4- 2 Fresh carrots/day (vit A), broccali, bell peppers, apples, pears, etc 3-5 servings per day as a start 5-1 portion of meat the size of pt's palm /day 6- calcium 600mg  every day because she cannot keep up with the requirements. 7-vit d 400 IU bid 8-B-complex 1 a day as she is avoiding carbs and grains. May decrease it to only on weekends after weight stabilized. 9- 3 tall glasses of water per day 10-1/2 teaspoon of salt per day to balance the water intake 11-snacks: carrots, bell peppers, pear or apple. 12- Do not buy sweets and keep it at home 13- 2 eggs per day on weekends only  F/U with next sugar log and wt check to check compliance.  Next visit, make sure pt understood that it is 1 serving of yogurt is 1 cup and orange  veggies for vit C and A. Add other veggies based on pt preference.

## 2024-10-03 ENCOUNTER — Other Ambulatory Visit: Payer: Self-pay

## 2024-10-03 VITALS — Wt 207.0 lb

## 2024-10-03 DIAGNOSIS — E1165 Type 2 diabetes mellitus with hyperglycemia: Secondary | ICD-10-CM

## 2024-10-03 NOTE — Progress Notes (Signed)
 Called the patient to clarify on what she has been injecting.  The patient said that she was injecting 0.5mg . last Monday. She was told to do so in her last visit. As I saw today she has 1mg  injection so I asked her how did know it is 0.5mg . She says that she does not  see number, she just dials up five lines. She received this pen  Per Dr Adella. She has to increase her Ozempic  to 1mg 

## 2024-10-03 NOTE — Progress Notes (Signed)
 Today's weight 207lbs  Last time weight 216lb  Sugars: AM =150-266  PM= 160-230 The patient states that she Jardiance  10mg , Glipizide  5 mg, Metformin  500mg  and Ozempic  5mg  I asked her if she does 0.5mg  but she says no its 5mg .She just started this dose. Last week she injected 2.5mg 

## 2024-10-03 NOTE — Progress Notes (Signed)
 We need to know if she was dialing up to 0.25 or 0.5 mg with the Ozempic  she has been giving to herself She now has the 1 mg, but not clear if she was using 0.5 or 0,25 in recent weeks.   Need above infor before having her up dose to 1 mg.

## 2024-10-04 LAB — HEMOGLOBIN A1C
Est. average glucose Bld gHb Est-mCnc: 243 mg/dL
Hgb A1c MFr Bld: 10.1 % — ABNORMAL HIGH (ref 4.8–5.6)

## 2024-10-07 ENCOUNTER — Ambulatory Visit: Payer: Self-pay | Admitting: Internal Medicine

## 2024-10-10 NOTE — Progress Notes (Signed)
 I spoke to patients son and he expressed that he will make sure that patent uses Ozempic  1mg  injection weekly.
